# Patient Record
Sex: Female | Born: 1965
Health system: Southern US, Community
[De-identification: ages and names within clinical notes are randomized; demographics above are authoritative.]

## PROBLEM LIST (undated history)

## (undated) DIAGNOSIS — N390 Urinary tract infection, site not specified: Secondary | ICD-10-CM

## (undated) DIAGNOSIS — R011 Cardiac murmur, unspecified: Secondary | ICD-10-CM

## (undated) DIAGNOSIS — Z5189 Encounter for other specified aftercare: Secondary | ICD-10-CM

## (undated) DIAGNOSIS — K8689 Other specified diseases of pancreas: Secondary | ICD-10-CM

## (undated) DIAGNOSIS — D126 Benign neoplasm of colon, unspecified: Secondary | ICD-10-CM

## (undated) DIAGNOSIS — M1990 Unspecified osteoarthritis, unspecified site: Secondary | ICD-10-CM

## (undated) DIAGNOSIS — T7840XA Allergy, unspecified, initial encounter: Secondary | ICD-10-CM

## (undated) DIAGNOSIS — D259 Leiomyoma of uterus, unspecified: Secondary | ICD-10-CM

## (undated) DIAGNOSIS — D649 Anemia, unspecified: Secondary | ICD-10-CM

## (undated) DIAGNOSIS — Z8619 Personal history of other infectious and parasitic diseases: Secondary | ICD-10-CM

## (undated) DIAGNOSIS — R51 Headache: Secondary | ICD-10-CM

## (undated) DIAGNOSIS — F419 Anxiety disorder, unspecified: Secondary | ICD-10-CM

## (undated) DIAGNOSIS — Z91018 Allergy to other foods: Secondary | ICD-10-CM

## (undated) DIAGNOSIS — I700 Atherosclerosis of aorta: Secondary | ICD-10-CM

## (undated) HISTORY — DX: Allergy to other foods: Z91.018

## (undated) HISTORY — DX: Personal history of other infectious and parasitic diseases: Z86.19

## (undated) HISTORY — PX: WISDOM TOOTH EXTRACTION: SHX21

## (undated) HISTORY — DX: Allergy, unspecified, initial encounter: T78.40XA

## (undated) HISTORY — DX: Encounter for other specified aftercare: Z51.89

## (undated) HISTORY — PX: COLONOSCOPY: SHX174

## (undated) HISTORY — DX: Headache: R51

## (undated) HISTORY — DX: Atherosclerosis of aorta: I70.0

## (undated) HISTORY — PX: UTERINE FIBROID SURGERY: SHX826

## (undated) HISTORY — DX: Leiomyoma of uterus, unspecified: D25.9

## (undated) HISTORY — DX: Urinary tract infection, site not specified: N39.0

## (undated) HISTORY — DX: Cardiac murmur, unspecified: R01.1

## (undated) HISTORY — DX: Other specified diseases of pancreas: K86.89

## (undated) HISTORY — DX: Benign neoplasm of colon, unspecified: D12.6

## (undated) HISTORY — DX: Anemia, unspecified: D64.9

## (undated) HISTORY — DX: Unspecified osteoarthritis, unspecified site: M19.90

## (undated) HISTORY — DX: Anxiety disorder, unspecified: F41.9

---

## 1998-05-20 ENCOUNTER — Other Ambulatory Visit: Admission: RE | Admit: 1998-05-20 | Discharge: 1998-05-20 | Payer: Self-pay | Admitting: Obstetrics and Gynecology

## 1998-09-20 ENCOUNTER — Inpatient Hospital Stay (HOSPITAL_COMMUNITY): Admission: AD | Admit: 1998-09-20 | Discharge: 1998-09-20 | Payer: Self-pay | Admitting: Obstetrics and Gynecology

## 1998-12-21 ENCOUNTER — Inpatient Hospital Stay (HOSPITAL_COMMUNITY): Admission: AD | Admit: 1998-12-21 | Discharge: 1998-12-21 | Payer: Self-pay | Admitting: Obstetrics and Gynecology

## 1998-12-21 ENCOUNTER — Encounter: Payer: Self-pay | Admitting: Obstetrics and Gynecology

## 1998-12-25 ENCOUNTER — Inpatient Hospital Stay (HOSPITAL_COMMUNITY): Admission: AD | Admit: 1998-12-25 | Discharge: 1998-12-27 | Payer: Self-pay | Admitting: Obstetrics and Gynecology

## 1999-01-27 ENCOUNTER — Other Ambulatory Visit: Admission: RE | Admit: 1999-01-27 | Discharge: 1999-01-27 | Payer: Self-pay | Admitting: Obstetrics and Gynecology

## 2000-10-10 ENCOUNTER — Other Ambulatory Visit: Admission: RE | Admit: 2000-10-10 | Discharge: 2000-10-10 | Payer: Self-pay | Admitting: Obstetrics and Gynecology

## 2001-10-14 ENCOUNTER — Other Ambulatory Visit: Admission: RE | Admit: 2001-10-14 | Discharge: 2001-10-14 | Payer: Self-pay | Admitting: Obstetrics and Gynecology

## 2003-01-14 ENCOUNTER — Other Ambulatory Visit: Admission: RE | Admit: 2003-01-14 | Discharge: 2003-01-14 | Payer: Self-pay | Admitting: Obstetrics and Gynecology

## 2004-04-05 ENCOUNTER — Other Ambulatory Visit: Admission: RE | Admit: 2004-04-05 | Discharge: 2004-04-05 | Payer: Self-pay | Admitting: Obstetrics and Gynecology

## 2004-08-18 ENCOUNTER — Encounter: Admission: RE | Admit: 2004-08-18 | Discharge: 2004-08-18 | Payer: Self-pay | Admitting: Orthopedic Surgery

## 2004-09-08 ENCOUNTER — Encounter: Admission: RE | Admit: 2004-09-08 | Discharge: 2004-09-08 | Payer: Self-pay | Admitting: Orthopedic Surgery

## 2004-10-11 ENCOUNTER — Other Ambulatory Visit: Admission: RE | Admit: 2004-10-11 | Discharge: 2004-10-11 | Payer: Self-pay | Admitting: Obstetrics and Gynecology

## 2009-12-20 ENCOUNTER — Encounter: Admission: RE | Admit: 2009-12-20 | Discharge: 2009-12-20 | Payer: Self-pay | Admitting: Obstetrics and Gynecology

## 2010-02-27 ENCOUNTER — Encounter: Payer: Self-pay | Admitting: Orthopedic Surgery

## 2014-01-15 ENCOUNTER — Other Ambulatory Visit: Payer: Self-pay | Admitting: Obstetrics and Gynecology

## 2014-01-15 ENCOUNTER — Other Ambulatory Visit (HOSPITAL_COMMUNITY): Payer: Self-pay | Admitting: Obstetrics and Gynecology

## 2014-01-15 DIAGNOSIS — R928 Other abnormal and inconclusive findings on diagnostic imaging of breast: Secondary | ICD-10-CM

## 2014-02-03 ENCOUNTER — Encounter (INDEPENDENT_AMBULATORY_CARE_PROVIDER_SITE_OTHER): Payer: Self-pay

## 2014-02-03 ENCOUNTER — Ambulatory Visit
Admission: RE | Admit: 2014-02-03 | Discharge: 2014-02-03 | Disposition: A | Discharge status: Home / Self Care | Payer: 59 | Source: Ambulatory Visit | Attending: Obstetrics and Gynecology | Admitting: Obstetrics and Gynecology

## 2014-02-03 DIAGNOSIS — R928 Other abnormal and inconclusive findings on diagnostic imaging of breast: Secondary | ICD-10-CM

## 2014-09-14 ENCOUNTER — Ambulatory Visit: Payer: Self-pay | Admitting: Family Medicine

## 2014-11-17 ENCOUNTER — Encounter: Payer: Self-pay | Admitting: Family Medicine

## 2014-11-17 ENCOUNTER — Ambulatory Visit (INDEPENDENT_AMBULATORY_CARE_PROVIDER_SITE_OTHER): Payer: 59 | Admitting: Family Medicine

## 2014-11-17 VITALS — BP 100/70 | HR 84 | Temp 98.4°F | Ht 67.25 in | Wt 148.2 lb

## 2014-11-17 DIAGNOSIS — M2550 Pain in unspecified joint: Secondary | ICD-10-CM | POA: Insufficient documentation

## 2014-11-17 DIAGNOSIS — J309 Allergic rhinitis, unspecified: Secondary | ICD-10-CM

## 2014-11-17 DIAGNOSIS — F330 Major depressive disorder, recurrent, mild: Secondary | ICD-10-CM | POA: Diagnosis not present

## 2014-11-17 DIAGNOSIS — Z23 Encounter for immunization: Secondary | ICD-10-CM

## 2014-11-17 DIAGNOSIS — F411 Generalized anxiety disorder: Secondary | ICD-10-CM | POA: Diagnosis not present

## 2014-11-17 DIAGNOSIS — R5382 Chronic fatigue, unspecified: Secondary | ICD-10-CM

## 2014-11-17 NOTE — Progress Notes (Signed)
HPI:  Audrey Cox is here to establish care.  Last PCP and physical:  Has the following chronic problems that require follow up and concerns today:  GAD/Depression: -reports chronic for many years  -symptoms: constant worry about everything, obsessive over everything being neat and tidy, mildy depressed mood chronically , irritable at times, some cog dysfunction "fog" -denies: SI, thoughts of self harm or harm to others, manic symptoms  Allergic Rhinitis: -since childhood and reports on allergy shots remotely -nasal and eye symptoms -seeing optho -taking allegra -no sob, wheezing, asthma  Chronic fatigue and chronic polyarthralgia: -joint issues primarily in the hands and back -reports told OA of back in the past remotely -reports thinks if from "hormones changing" -denies: fevers, weight loss, redness, swelling, weakness, numbness   ROS negative for unless reported above: fevers, unintentional weight loss, hearing or vision loss, chest pain, palpitations, struggling to breath, hemoptysis, melena, hematochezia, hematuria, falls, loc, si, thoughts of self harm  Past Medical History  Diagnosis Date  . Arthritis     cervical spine and lumbar spine per patient(diagnosed by DC)  . Frequent headaches   . Allergy   . Heart murmur   . Blood transfusion without reported diagnosis     1993 post-op fibroid excision  . Urinary tract infection   . History of chicken pox     Past Surgical History  Procedure Laterality Date  . Uterine fibroid surgery  [id number]    Family History  Problem Relation Age of Onset  . Arthritis Paternal Grandmother   . Arthritis Maternal Grandmother   . Rheum arthritis Paternal Grandfather   . Arthritis Father   . Breast cancer Maternal Aunt   . Skin cancer Maternal Aunt   . Hyperlipidemia Father   . Heart disease Maternal Grandmother   . Hypertension Maternal Grandmother     Social History   Social History  . Marital Status: Married     Spouse Name: N/A  . Number of Children: N/A  . Years of Education: N/A   Social History Main Topics  . Smoking status: Never Smoker   . Smokeless tobacco: None  . Alcohol Use: None  . Drug Use: None  . Sexual Activity: Not Asked   Other Topics Concern  . None   Social History Narrative     Current outpatient prescriptions:  .  fexofenadine (ALLEGRA) 180 MG tablet, Take 180 mg by mouth daily., Disp: , Rfl:   EXAM:  Filed Vitals:   11/17/14 1141  BP: 100/70  Pulse: 84  Temp: 98.4 F (36.9 C)    Body mass index is 23.04 kg/(m^2).  GENERAL: vitals reviewed and listed above, alert, oriented, appears well hydrated and in no acute distress  HEENT: atraumatic, conjunttiva clear, no obvious abnormalities on inspection of external nose and ears  NECK: no obvious masses on inspection  LUNGS: clear to auscultation bilaterally, no wheezes, rales or rhonchi, good air movement  CV: HRRR, no peripheral edema  MS: moves all extremities without noticeable abnormality, no swelling or redness of joints, ? Mild OA nodes hands  PSYCH: pleasant and cooperative, no obvious depression or anxiety  ASSESSMENT AND PLAN:  Discussed the following assessment and plan:  MDD/GAD: -discussed options for tx -she decided to start with CBT -close follow up and prn follow up if any worsening -emergency precautions  AR: -add INS, allergy specialist follow up if uncontrolled and sig morbidity  Polyarthragia/Fatigue: -query from MDD, possible mild OA -advised labs: BMP, CBC, TSH, RF  to eval for other - she opted to do at physical  -We reviewed the PMH, PSH, FH, SH, Meds and Allergies. -We provided refills for any medications we will prescribe as needed. -We addressed current concerns per orders and patient instructions. -We have asked for records for pertinent exams, studies, vaccines and notes from previous providers. -We have advised patient to follow up per instructions  below.   -Patient advised to return or notify a doctor immediately if symptoms worsen or persist or new concerns arise.  Patient Instructions  BEFORE YOU LEAVE: -flu shot -schedule physical in 2-3 months; come fasting and we will plan to do labs that day  Flonase 2 sprays each nostril daily and allegra - follow up with allergist if needed  Call today to schedule an appointment with Dr. Glennon Hamilton to help with the anxiety - follow up at physical or sooner as needed  We recommend the following healthy lifestyle measures: - eat a healthy whole foods diet consisting of regular small meals composed of vegetables, fruits, beans, nuts, seeds, healthy meats such as white chicken and fish and whole grains.  - avoid sweets, white starchy foods, fried foods, fast food, processed foods, sodas, red meet and other fattening foods.  - get a least 150-300 minutes of aerobic exercise per week.       Colin Benton R.

## 2014-11-17 NOTE — Progress Notes (Signed)
Pre visit review using our clinic review tool, if applicable. No additional management support is needed unless otherwise documented below in the visit note. 

## 2014-11-17 NOTE — Patient Instructions (Signed)
BEFORE YOU LEAVE: -flu shot -schedule physical in 2-3 months; come fasting and we will plan to do labs that day  Flonase 2 sprays each nostril daily and allegra - follow up with allergist if needed  Call today to schedule an appointment with Dr. Glennon Hamilton to help with the anxiety - follow up at physical or sooner as needed  We recommend the following healthy lifestyle measures: - eat a healthy whole foods diet consisting of regular small meals composed of vegetables, fruits, beans, nuts, seeds, healthy meats such as white chicken and fish and whole grains.  - avoid sweets, white starchy foods, fried foods, fast food, processed foods, sodas, red meet and other fattening foods.  - get a least 150-300 minutes of aerobic exercise per week.

## 2014-12-29 ENCOUNTER — Ambulatory Visit: Payer: 59 | Admitting: Family Medicine

## 2015-02-10 ENCOUNTER — Ambulatory Visit (INDEPENDENT_AMBULATORY_CARE_PROVIDER_SITE_OTHER): Payer: 59 | Admitting: Family Medicine

## 2015-02-10 ENCOUNTER — Encounter: Payer: Self-pay | Admitting: Family Medicine

## 2015-02-10 VITALS — BP 110/80 | HR 85 | Temp 97.4°F | Ht 67.75 in | Wt 150.6 lb

## 2015-02-10 DIAGNOSIS — M2550 Pain in unspecified joint: Secondary | ICD-10-CM

## 2015-02-10 DIAGNOSIS — Z0000 Encounter for general adult medical examination without abnormal findings: Secondary | ICD-10-CM

## 2015-02-10 DIAGNOSIS — F411 Generalized anxiety disorder: Secondary | ICD-10-CM

## 2015-02-10 DIAGNOSIS — R5382 Chronic fatigue, unspecified: Secondary | ICD-10-CM

## 2015-02-10 DIAGNOSIS — F330 Major depressive disorder, recurrent, mild: Secondary | ICD-10-CM

## 2015-02-10 LAB — COMPREHENSIVE METABOLIC PANEL
ALK PHOS: 74 U/L (ref 39–117)
ALT: 15 U/L (ref 0–35)
AST: 13 U/L (ref 0–37)
Albumin: 4.5 g/dL (ref 3.5–5.2)
BUN: 20 mg/dL (ref 6–23)
CHLORIDE: 102 meq/L (ref 96–112)
CO2: 28 mEq/L (ref 19–32)
Calcium: 9.9 mg/dL (ref 8.4–10.5)
Creatinine, Ser: 0.77 mg/dL (ref 0.40–1.20)
GFR: 85.38 mL/min (ref >60.00)
GLUCOSE: 90 mg/dL (ref 70–99)
POTASSIUM: 4.8 meq/L (ref 3.5–5.1)
SODIUM: 139 meq/L (ref 135–145)
TOTAL PROTEIN: 7.2 g/dL (ref 6.0–8.3)
Total Bilirubin: 0.7 mg/dL (ref 0.2–1.2)

## 2015-02-10 LAB — CBC WITH DIFFERENTIAL/PLATELET
BASOS PCT: 0.6 % (ref 0.0–3.0)
Basophils Absolute: 0.1 10*3/uL (ref 0.0–0.1)
EOS PCT: 1 % (ref 0.0–5.0)
Eosinophils Absolute: 0.1 10*3/uL (ref 0.0–0.7)
HCT: 45.6 % (ref 36.0–46.0)
Hemoglobin: 15.3 g/dL — ABNORMAL HIGH (ref 12.0–15.0)
LYMPHS ABS: 1.9 10*3/uL (ref 0.7–4.0)
Lymphocytes Relative: 20.1 % (ref 12.0–46.0)
MCHC: 33.7 g/dL (ref 30.0–36.0)
MCV: 88.3 fl (ref 78.0–100.0)
MONO ABS: 0.5 10*3/uL (ref 0.1–1.0)
Monocytes Relative: 5.7 % (ref 3.0–12.0)
NEUTROS ABS: 6.9 10*3/uL (ref 1.4–7.7)
NEUTROS PCT: 72.6 % (ref 43.0–77.0)
PLATELETS: 352 10*3/uL (ref 150.0–400.0)
RBC: 5.16 Mil/uL — ABNORMAL HIGH (ref 3.87–5.11)
RDW: 13.3 % (ref 11.5–15.5)
WBC: 9.4 10*3/uL (ref 4.0–10.5)

## 2015-02-10 LAB — LIPID PANEL
CHOLESTEROL: 203 mg/dL — AB (ref 0–200)
HDL: 59.2 mg/dL (ref >39.00)
LDL Cholesterol: 117 mg/dL — ABNORMAL HIGH (ref 0–99)
NonHDL: 144.16
Total CHOL/HDL Ratio: 3
Triglycerides: 137 mg/dL (ref 0.0–149.0)
VLDL: 27.4 mg/dL (ref 0.0–40.0)

## 2015-02-10 LAB — HEMOGLOBIN A1C: HEMOGLOBIN A1C: 5.5 % (ref 4.6–6.5)

## 2015-02-10 LAB — VITAMIN D 25 HYDROXY (VIT D DEFICIENCY, FRACTURES): VITD: 26.34 ng/mL — AB (ref 30.00–100.00)

## 2015-02-10 LAB — TSH: TSH: 1.06 u[IU]/mL (ref 0.35–4.50)

## 2015-02-10 NOTE — Patient Instructions (Addendum)
BEFORE YOU LEAVE: -labs -follow up in 6 months  -We have ordered labs or studies at this visit. It can take up to 1-2 weeks for results and processing. We will contact you with instructions IF your results are abnormal. Normal results will be released to your [facility]. If you have not heard from [place] or can not find your results in [facility] in 2 weeks please contact our office.  We recommend the following healthy lifestyle measures: - eat a healthy whole foods diet consisting of regular small meals composed of vegetables, fruits, beans, nuts, seeds, healthy meats such as white chicken and fish and whole grains.  - avoid sweets, white starchy foods, fried foods, fast food, processed foods, sodas, red meet and other fattening foods.  - get a least 150-300 minutes of aerobic exercise per week.   Vit D3 [phone] IU daily  Consider counseling - please try to call again if you start to feel bad again.

## 2015-02-10 NOTE — Progress Notes (Signed)
HPI:  Here for CPE:  -Concerns and/or follow up today:   GAD/Depression: -reports chronic for many years  - she opted for cbt at last visitbut reports felt better and call was not returned -symptoms: constant worry about everything, obsessive over everything being neat and tidy, mildy depressed mood chronically , irritable at times, some cog dysfunction "fog" -chronic low energy since childhood and muscles hurt when she lifts weights and chronic intermittent back pain and pai nin joints in hands - told was OA in the past wants to check labs for RA. She also wants to check vit D level. Does not take Vit D. -denies: SI, thoughts of self harm or harm to others, manic symptoms  Allergic Rhinitis: -since childhood and reports on allergy shots remotely -nasal and eye symptoms -seeing optho -taking allegra -no sob, wheezing, asthma   -Diet: variety of foods, balance and well rounded, eat out a fair amount  -Exercise:  regular exercise - working with a trainer  -Taking folic acid, vitamin D or calcium: no  -Diabetes and Dyslipidemia Screening: today  -Hx of HTN: no  -Vaccines: UTD  -pap history:sees gyn yearly per her report - Dr. Matthew Saras  -FDLMP: regular, normal  -sexual activity: yes, female partner, no new partners  -wants STI testing (Hep C if born [birth year]): no  -FH breast, colon or ovarian ca: see FH -she reports sees Dr. Matthew Saras for breast and women's health   -Alcohol, Tobacco, drug use: see social history  Review of Systems - no fevers, unintentional weight loss, vision loss, hearing loss, chest pain, sob, hemoptysis, melena, hematochezia, hematuria, genital discharge, changing or concerning skin lesions, bleeding, bruising, loc, thoughts of self harm or SI  Past Medical History  Diagnosis Date  . Arthritis     cervical spine and lumbar spine per patient(diagnosed by DC)  . Frequent headaches   . Allergy   . Heart murmur   . Blood transfusion without reported  diagnosis     1993 post-op fibroid excision  . Urinary tract infection   . History of chicken pox     Past Surgical History  Procedure Laterality Date  . Uterine fibroid surgery  [id number]    Family History  Problem Relation Age of Onset  . Arthritis Paternal Grandmother   . Arthritis Maternal Grandmother   . Rheum arthritis Paternal Grandfather   . Arthritis Father   . Breast cancer Maternal Aunt   . Skin cancer Maternal Aunt   . Hyperlipidemia Father   . Heart disease Maternal Grandmother   . Hypertension Maternal Grandmother     Social History   Social History  . Marital Status: Married    Spouse Name: N/A  . Number of Children: N/A  . Years of Education: N/A   Social History Main Topics  . Smoking status: Never Smoker   . Smokeless tobacco: None  . Alcohol Use: None  . Drug Use: None  . Sexual Activity: Not Asked   Other Topics Concern  . 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  fexofenadine (ALLEGRA) 180 MG tablet, Take 180 mg by mouth daily., Disp: , Rfl:   EXAM:  Filed Vitals:  02/10/15 0832  BP: 110/80  Pulse: 85  Temp: 97.4 F (36.3 C)    GENERAL: vitals reviewed and listed below, alert, oriented, appears well hydrated and in no acute distress  HEENT: head atraumatic, PERRLA, normal appearance of eyes, ears, nose and mouth. moist mucus membranes.  NECK: supple, no masses or lymphadenopathy  LUNGS: clear to auscultation bilaterally, no rales, rhonchi or wheeze  CV: HRRR, no peripheral edema or cyanosis, normal pedal pulses  BREAST: declined  ABDOMEN: bowel sounds normal, soft, non tender to palpation, no masses, no rebound or guarding  GU: declined  SKIN: no rash or abnormal lesions  MS:  normal gait, moves all extremities normally, I do not appreciate and sig swelling or deformity of joints in hands  NEURO: CN II-XII grossly intact, normal muscle strength and sensation to light touch on extremities  PSYCH: normal affect, pleasant and cooperative  ASSESSMENT AND PLAN:  Discussed the following assessment and plan:  Visit for preventive health examination - Plan: TSH, CMP, CBC with Differential, Lipid Panel, Hemoglobin A1c  Mild episode of recurrent major depressive disorder (HCC) GAD (generalized anxiety disorder) Polyarthralgia - Plan: Cyclic citrul peptide antibody, IgG Chronic fatigue -her whole life -encouraged supporting the body with healthy diet, regular gentle exercise and CBT, may consider antidepressant -full labs with physical today including anti ccp   -Discussed and advised all [place] preventive services health task force level A and B recommendations for age, sex and risks.  -Advised at least 150 minutes of exercise per week and a healthy diet low in saturated fats and sweets and consisting of fresh fruits and vegetables, lean meats such as fish and white chicken and whole grains.  -labs, studies and vaccines per orders this encounter  Orders Placed This Encounter  Procedures  . Cyclic citrul peptide antibody, IgG  . TSH  . CMP  . CBC with Differential  . Lipid Panel  . Hemoglobin A1c  . Vitamin D, 25-hydroxy    Patient advised to return to clinic immediately if symptoms worsen or persist or new concerns.  Patient Instructions  BEFORE YOU LEAVE: -labs -follow up in 6 months  -We have ordered labs or studies at this visit. It can take up to 1-2 weeks for results and processing. We will contact you with instructions IF your results are abnormal. Normal results will be released to your [facility]. If you have not heard from [place] or can not find your results in [facility] in 2 weeks please contact our office.  We recommend the following healthy lifestyle  measures: - eat a healthy whole foods diet consisting of regular small meals composed of vegetables, fruits, beans, nuts, seeds, healthy meats such as white chicken and fish and whole grains.  - avoid sweets, white starchy foods, fried foods, fast food, processed foods, sodas, red meet and other fattening foods.  - get a least 150-300 minutes of aerobic exercise per week.   Vit D3 [phone] IU daily  Consider counseling - please try to call again if you start to feel bad again.         No Follow-up on file.  Colin Benton R.

## 2015-02-10 NOTE — Progress Notes (Signed)
Pre visit review using our clinic review tool, if applicable. No additional management support is needed unless otherwise documented below in the visit note. 

## 2015-02-11 LAB — CYCLIC CITRUL PEPTIDE ANTIBODY, IGG: Cyclic Citrullin Peptide Ab: <16 Units

## 2015-08-12 ENCOUNTER — Ambulatory Visit: Payer: 59 | Admitting: Family Medicine

## 2016-04-20 NOTE — Progress Notes (Signed)
HPI:   Schedule as CPE but she wants to reschedule CPE and due regular visit today for anxiety and depression. Declined vaccines today. Is fasting so wants labs.   Hx GAD/Depression: -symptoms since childhood -did not do CBT -declined medications in the past -worsening the last several months -daily symptoms: mildly depressed mood, worries about everything all the time, stressed, feels overwhelmed, poor focus, anhedonia, fatigue, increased desire to sleep -worse around periods, but most of the time -occ pressure or rapid speech but no other manic symptoms now or in the past -? ADD or ADHD, but never diagnosed or treated -no SI, thoughts of self harm or harm to others, no hx of  ROS: See pertinent positives and negatives per HPI.  Past Medical History:  Diagnosis Date  . Allergy   . Arthritis    cervical spine and lumbar spine per patient(diagnosed by DC)  . Blood transfusion without reported diagnosis    1993 post-op fibroid excision  . Frequent headaches   . Heart murmur   . History of chicken pox   . Urinary tract infection     Past Surgical History:  Procedure Laterality Date  . UTERINE FIBROID SURGERY  [id number]    Family History  Problem Relation Age of Onset  . Arthritis Paternal Grandmother   . Arthritis Maternal Grandmother   . Rheum arthritis Paternal Grandfather   . Arthritis Father   . Breast cancer Maternal Aunt   . Skin cancer Maternal Aunt   . Hyperlipidemia Father   . Heart disease Maternal Grandmother   . Hypertension Maternal Grandmother     Social History   Social History  . Marital status: Married    Spouse name: N/A  . Number of children: N/A  . Years of education: N/A   Social History Main Topics  . Smoking status: Never Smoker  . Smokeless tobacco: Never Used  . Alcohol use None  . Drug use: Unknown  . Sexual activity: Not Asked   Other Topics Concern  . 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  fexofenadine (ALLEGRA) 180 MG tablet, Take 180 mg by mouth daily., Disp: , Rfl:   EXAM:  Vitals:   04/21/16 0853  BP: 98/72  Pulse: 91  Temp: 97.8 F (36.6 C)    Body mass index is 23.01 kg/m.  GENERAL: vitals reviewed and listed above, alert, oriented, appears well hydrated and in no acute distress  HEENT: atraumatic, conjunttiva clear, no obvious abnormalities on inspection of external nose and ears  NECK: no obvious masses on inspection  LUNGS: clear to auscultation bilaterally, no wheezes, rales or rhonchi, good air movement  CV: HRRR, no peripheral edema  MS: moves all extremities without noticeable abnormality  PSYCH: pleasant and cooperative, tearful at times, somewhat anxious, no figiting, good eye contact, speech and thought processing grossly intact  ASSESSMENT AND PLAN:  Discussed the following assessment and plan: More than 50% of over 25 minutes spent in total in caring for this patient was spent face-to-face with the patient, counseling and/or coordinating care.    GAD (generalized anxiety disorder) - Plan: TSH  Mild episode of recurrent major depressive disorder ([place]) -  Plan: Hemoglobin A1c  Chronic fatigue - Plan: TSH, Hemoglobin A1c  Screening for hyperlipidemia - Plan: Lipid panel  --we discussed possible serious and likely etiologies, workup and treatment, treatment risks and return precautions - longstanding depression/anxiety possible mild bipolar d/o and possible perimenopausal worsening of symptoms -after this discussion, Audrey Cox opted for CBT/eval with pscyhology -follow up advised 1 month with CPE, labs today as she came fasting -vaccines next visit, declined today -of course, we advised Zykeriah  to return or notify a doctor  immediately if symptoms worsen or persist or new concerns arise.   Patient Instructions  BEFORE YOU LEAVE: -follow up: reschedule physical for 1 month but she will go to lab today  Call today to set up counseling/cognitive behavioral therapy  Call immediately or seek care immediately if symptoms worsening  I hope you feel better soon!  WE NOW OFFER   [facility] FAST TRACK!!!  SAME DAY Appointments for ACUTE CARE  Such as: Sprains, Injuries, cuts, abrasions, rashes, muscle pain, joint pain, back pain Colds, flu, sore throats, headache, allergies, cough, fever  Ear pain, sinus and eye infections Abdominal pain, nausea, vomiting, diarrhea, upset stomach Animal/insect bites  3 Easy Ways to Schedule: Walk-In Scheduling Call in scheduling Mychart Sign-up: [email or web address]            Colin Benton R., DO

## 2016-04-21 ENCOUNTER — Telehealth: Payer: Self-pay | Admitting: *Deleted

## 2016-04-21 ENCOUNTER — Ambulatory Visit (INDEPENDENT_AMBULATORY_CARE_PROVIDER_SITE_OTHER): Payer: 59 | Admitting: Family Medicine

## 2016-04-21 ENCOUNTER — Encounter: Payer: Self-pay | Admitting: Family Medicine

## 2016-04-21 VITALS — BP 98/72 | HR 91 | Temp 97.8°F | Ht 67.5 in | Wt 149.1 lb

## 2016-04-21 DIAGNOSIS — F411 Generalized anxiety disorder: Secondary | ICD-10-CM

## 2016-04-21 DIAGNOSIS — Z13220 Encounter for screening for lipoid disorders: Secondary | ICD-10-CM | POA: Diagnosis not present

## 2016-04-21 DIAGNOSIS — F330 Major depressive disorder, recurrent, mild: Secondary | ICD-10-CM | POA: Diagnosis not present

## 2016-04-21 DIAGNOSIS — R5382 Chronic fatigue, unspecified: Secondary | ICD-10-CM

## 2016-04-21 LAB — LIPID PANEL
CHOLESTEROL: 197 mg/dL (ref 0–200)
HDL: 61.4 mg/dL (ref >39.00)
LDL Cholesterol: 108 mg/dL — ABNORMAL HIGH (ref 0–99)
NonHDL: 135.67
Total CHOL/HDL Ratio: 3
Triglycerides: 136 mg/dL (ref 0.0–149.0)
VLDL: 27.2 mg/dL (ref 0.0–40.0)

## 2016-04-21 LAB — TSH: TSH: 0.69 u[IU]/mL (ref 0.35–4.50)

## 2016-04-21 LAB — HEMOGLOBIN A1C: HEMOGLOBIN A1C: 5.5 % (ref 4.6–6.5)

## 2016-04-21 NOTE — Telephone Encounter (Signed)
Per Dr Maudie Mercury I called Dr Melanee Left office and per Bethann Berkshire she does not start seeing patients until approximately June 2018 or later due to credentialing,etc and Dr Glennon Hamilton is seeing new pts and is scheduled out at least 2 weeks.  I called the pt and left a detailed message at her cell number with this information and to ask for an appt with Dr Glennon Hamilton as per Dr Maudie Mercury she should not wait for an appt in June or later.

## 2016-04-21 NOTE — Progress Notes (Signed)
Pre visit review using our clinic review tool, if applicable. No additional management support is needed unless otherwise documented below in the visit note. 

## 2016-04-21 NOTE — Patient Instructions (Signed)
BEFORE YOU LEAVE: -follow up: reschedule physical for 1 month but she will go to lab today  Call today to set up counseling/cognitive behavioral therapy  Call immediately or seek care immediately if symptoms worsening  I hope you feel better soon!  WE NOW OFFER   [facility] FAST TRACK!!!  SAME DAY Appointments for ACUTE CARE  Such as: Sprains, Injuries, cuts, abrasions, rashes, muscle pain, joint pain, back pain Colds, flu, sore throats, headache, allergies, cough, fever  Ear pain, sinus and eye infections Abdominal pain, nausea, vomiting, diarrhea, upset stomach Animal/insect bites  3 Easy Ways to Schedule: Walk-In Scheduling Call in scheduling Mychart Sign-up: [email or web address]

## 2016-05-04 ENCOUNTER — Ambulatory Visit: Payer: 59 | Admitting: Psychology

## 2016-05-21 NOTE — Progress Notes (Signed)
HPI:  Follow up depression: Note scheduled as CPE but prefers f/u instead as has things to discuss and has CPE with gyn and skin check with dermatologist. Already did labs. Depression and anxiety worse, see PHQ9. Constant worry. Husband with drinking and health issues. Teenagers. Feels safe. No abuse, SI or thoughts of self harm.  Had to cancel psychology appt as husband was in hospital with gallstones and pancreatitis. Plans to reschedule. Wants to try a medication. Also wants to check vit D. Reports hx deficiency. Eats a lot of eggs, butter, red meat and coconut oil. Wants to know how to change cholesterol numbers. Does exercise and eats healthy otherwise. ROS: See pertinent positives and negatives per HPI.  Past Medical History:  Diagnosis Date  . Allergy   . Arthritis    cervical spine and lumbar spine per patient(diagnosed by DC)  . Blood transfusion without reported diagnosis    1993 post-op fibroid excision  . Frequent headaches   . Heart murmur   . History of chicken pox   . Urinary tract infection     Past Surgical History:  Procedure Laterality Date  . UTERINE FIBROID SURGERY  [id number]    Family History  Problem Relation Age of Onset  . Arthritis Paternal Grandmother   . Arthritis Maternal Grandmother   . Rheum arthritis Paternal Grandfather   . Arthritis Father   . Breast cancer Maternal Aunt   . Skin cancer Maternal Aunt   . Hyperlipidemia Father   . Heart disease Maternal Grandmother   . Hypertension Maternal Grandmother     Social History   Social History  . Marital status: Married    Spouse name: N/A  . Number of children: N/A  . Years of education: N/A   Social History Main Topics  . Smoking status: Never Smoker  . Smokeless tobacco: Never Used  . Alcohol use None  . Drug use: Unknown  . Sexual activity: Not Asked   Other Topics Concern  . 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  fexofenadine (ALLEGRA) 180 MG tablet, Take 180 mg by mouth daily., Disp: , Rfl:   EXAM:  Vitals:   05/22/16 0925  BP: 100/78  Pulse: 89  Temp: 98.2 F (36.8 C)    Body mass index is 23.27 kg/m.  GENERAL: vitals reviewed and listed above, alert, oriented, appears well hydrated and in no acute distress  HEENT: atraumatic, conjunttiva clear, no obvious abnormalities on inspection of external nose and ears  NECK: no obvious masses on inspection  LUNGS: clear to auscultation bilaterally, no wheezes, rales or rhonchi, good air movement  CV: HRRR, no peripheral edema  MS: moves all extremities without noticeable abnormality  PSYCH: pleasant and cooperative, no obvious depression or anxiety  ASSESSMENT AND PLAN:  Discussed the following assessment and plan:  Mild episode of recurrent major depressive disorder (HCC)  GAD (generalized anxiety disorder)  Vitamin D deficiency - Plan: VITAMIN D 25 Hydroxy (Vit-D Deficiency, Fractures)  Hyperlipidemia, unspecified hyperlipidemia type  -discussed tx options for depression/anxiety and risks various options. She wants to try counseling and effexor. RX sent. She plans to call counselor to reschedule. F/u 1  month, sooner if needed. -labs per orders -lifestyle recs, advised Mediterranean diet -Patient advised to return or notify a doctor immediately if symptoms worsen or persist or new concerns arise.  There are no Patient Instructions on file for this visit.  Colin Benton R., DO

## 2016-05-22 ENCOUNTER — Encounter: Payer: Self-pay | Admitting: Family Medicine

## 2016-05-22 ENCOUNTER — Ambulatory Visit (INDEPENDENT_AMBULATORY_CARE_PROVIDER_SITE_OTHER): Payer: 59 | Admitting: Family Medicine

## 2016-05-22 VITALS — BP 100/78 | HR 89 | Temp 98.2°F | Ht 67.5 in | Wt 150.8 lb

## 2016-05-22 DIAGNOSIS — F411 Generalized anxiety disorder: Secondary | ICD-10-CM | POA: Diagnosis not present

## 2016-05-22 DIAGNOSIS — F330 Major depressive disorder, recurrent, mild: Secondary | ICD-10-CM

## 2016-05-22 DIAGNOSIS — E559 Vitamin D deficiency, unspecified: Secondary | ICD-10-CM | POA: Diagnosis not present

## 2016-05-22 DIAGNOSIS — E785 Hyperlipidemia, unspecified: Secondary | ICD-10-CM

## 2016-05-22 LAB — VITAMIN D 25 HYDROXY (VIT D DEFICIENCY, FRACTURES): VITD: 27.45 ng/mL — ABNORMAL LOW (ref 30.00–100.00)

## 2016-05-22 MED ORDER — VENLAFAXINE HCL ER 37.5 MG PO CP24: 37.5000 mg | ORAL_CAPSULE | Freq: Every day | ORAL | 3 refills | Status: DC

## 2016-05-22 NOTE — Patient Instructions (Signed)
BEFORE YOU LEAVE: -follow up: 1 month -lab  Start the Effexor and take once daily. If you ever decide to stop this medication please schedule an appointment to discuss a taper.  Call to reschedule counseling appointment.  Try to cut back on bad fats (red meat, eggs, butter, fried foods, etc.) and replace with good fats (canola oil, walnuts, nuts and seeds, olive oil, olives, fish, etc).  I hope you feel better soon!   We recommend the following healthy lifestyle for LIFE: 1) Small portions.   Tip: eat off of a salad plate instead of a dinner plate.  Tip: if you need more or a snack choose fruits, veggies and/or a handful of nuts or seeds.  2) Eat a healthy clean diet.  * Tip: Avoid (less then 1 serving per week): processed foods, sweets, sweetened drinks, white starches (rice, flour, bread, potatoes, pasta, etc), red meat, fast foods, butter  *Tip: CHOOSE instead   * 5-9 servings per day of fresh or frozen fruits and vegetables (but not corn, potatoes, bananas, canned or dried fruit)   *nuts and seeds, beans   *olives and olive oil   *small portions of lean meats such as fish and white chicken    *small portions of whole grains  3)Get at least 150 minutes of sweaty aerobic exercise per week.  4)Reduce stress - consider counseling, meditation and relaxation to balance other aspects of your life.

## 2016-05-22 NOTE — Progress Notes (Signed)
Pre visit review using our clinic review tool, if applicable. No additional management support is needed unless otherwise documented below in the visit note. 

## 2016-05-23 ENCOUNTER — Encounter: Payer: 59 | Admitting: Family Medicine

## 2016-05-31 ENCOUNTER — Telehealth: Payer: Self-pay | Admitting: Family Medicine

## 2016-05-31 NOTE — Telephone Encounter (Signed)
° ° ° °  Pt cancel her appt for May said she decided not to take the [facility] and wanted Dr Maudie Mercury to know

## 2016-05-31 NOTE — Telephone Encounter (Signed)
Spoke to the pt.  She picked up the prescription from the pharmacy but decided not to take it.  She stated that she doesn't feel that she is "that bad."  Mostly has trouble during her menstrual cycle and was afraid the medication would "alter her brain."  She plans of following up with Dr. Glennon Hamilton.  Informed her that I will forward this information to Dr. Maudie Mercury and will call back if any further recommendations.

## 2016-06-22 ENCOUNTER — Ambulatory Visit: Payer: 59 | Admitting: Family Medicine

## 2016-08-01 ENCOUNTER — Ambulatory Visit (INDEPENDENT_AMBULATORY_CARE_PROVIDER_SITE_OTHER): Payer: 59 | Admitting: Psychology

## 2016-08-01 DIAGNOSIS — F411 Generalized anxiety disorder: Secondary | ICD-10-CM

## 2016-08-29 ENCOUNTER — Ambulatory Visit: Payer: 59 | Admitting: Psychology

## 2016-10-23 ENCOUNTER — Telehealth: Payer: Self-pay | Admitting: Family Medicine

## 2016-10-23 NOTE — Telephone Encounter (Signed)
I called the pt and informed her of the message below and a follow up was scheduled for 10/9.

## 2016-10-23 NOTE — Telephone Encounter (Signed)
Ok, ensure has follow up in 4-6 weeks. Thanks.

## 2016-10-23 NOTE — Telephone Encounter (Signed)
° ° ° °  Pt call to say she didn't take the the below med when it was first given to her. She said effective today 10/23/16 she will start taking the below med   venlafaxine XR (EFFEXOR XR) 37.5 MG 24 hr capsule

## 2016-10-26 ENCOUNTER — Encounter: Payer: Self-pay | Admitting: Family Medicine

## 2016-10-31 ENCOUNTER — Ambulatory Visit: Payer: Self-pay | Admitting: Family Medicine

## 2016-10-31 ENCOUNTER — Telehealth: Payer: Self-pay | Admitting: Family Medicine

## 2016-10-31 ENCOUNTER — Ambulatory Visit (INDEPENDENT_AMBULATORY_CARE_PROVIDER_SITE_OTHER): Payer: BLUE CROSS/BLUE SHIELD | Admitting: Family Medicine

## 2016-10-31 ENCOUNTER — Encounter: Payer: Self-pay | Admitting: Family Medicine

## 2016-10-31 VITALS — BP 96/68 | HR 100 | Temp 98.3°F | Ht 67.5 in

## 2016-10-31 DIAGNOSIS — F330 Major depressive disorder, recurrent, mild: Secondary | ICD-10-CM

## 2016-10-31 DIAGNOSIS — F411 Generalized anxiety disorder: Secondary | ICD-10-CM

## 2016-10-31 DIAGNOSIS — K219 Gastro-esophageal reflux disease without esophagitis: Secondary | ICD-10-CM

## 2016-10-31 DIAGNOSIS — R079 Chest pain, unspecified: Secondary | ICD-10-CM | POA: Diagnosis not present

## 2016-10-31 MED ORDER — ESCITALOPRAM OXALATE 5 MG PO TABS: 5.0000 mg | ORAL_TABLET | Freq: Every day | ORAL | 1 refills | Status: DC

## 2016-10-31 NOTE — Telephone Encounter (Signed)
Pt state that she is having side effect from venlafaxine XR (EFFEXOR XR) that she is not able to deal with and has stopped taking it and would like to see what Dr. Maudie Mercury would suggest her to do.

## 2016-10-31 NOTE — Telephone Encounter (Signed)
Appt scheduled for today at 11:15am.

## 2016-10-31 NOTE — Patient Instructions (Signed)
BEFORE YOU LEAVE: -EKG -follow up: 1 month  STOP the Effexor.  START the Lexapro 5 mg once daily.  Continue regular counseling.  Get regular gentle exercise and eat a healthy diet.  Start over-the-counter Nexium and take once daily for 1 week for that acid reflux.  For the chest discomfort you could try a topical menthol ( Tiger balm - this is available over-the-counter), heat, gentle stretching and as needed Tylenol or Aleve per instructions.

## 2016-10-31 NOTE — Telephone Encounter (Signed)
appt please so we can discuss and figure out what to do next.
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
## 2016-11-14 ENCOUNTER — Ambulatory Visit: Payer: Self-pay | Admitting: Family Medicine

## 2016-11-21 ENCOUNTER — Ambulatory Visit: Payer: Self-pay | Admitting: Family Medicine

## 2016-12-13 DIAGNOSIS — Z6822 Body mass index (BMI) 22.0-22.9, adult: Secondary | ICD-10-CM | POA: Diagnosis not present

## 2016-12-13 DIAGNOSIS — Z01419 Encounter for gynecological examination (general) (routine) without abnormal findings: Secondary | ICD-10-CM | POA: Diagnosis not present

## 2016-12-13 DIAGNOSIS — Z1231 Encounter for screening mammogram for malignant neoplasm of breast: Secondary | ICD-10-CM | POA: Diagnosis not present

## 2016-12-20 NOTE — Progress Notes (Signed)
HPI:  DUe for tetanus booster  Follow up Depression and anxiety: -she did not tolerate effexor -started low dose lexapro 10/2016 -doing CBT -reports: Doing better, reports the Lexapro has taken the edge off of her symptoms and PMS was much better this month, see PHQ 9 -she does still have some depressed mood on a regular basis and some other symptoms of depression -denies: Suicidal ideation, severe symptoms side effects to the medication  GERD: -did trial PPI x 1 week -reports: Symptoms resolved with the PPI.  She does have recurrence of symptoms with dietary indiscretion.  Chest wall pain: -1 month follow up, reproducible on exam -reports: Resolved ROS: See pertinent positives and negatives per HPI.  Past Medical History:  Diagnosis Date  . Allergy   . Arthritis    cervical spine and lumbar spine per patient(diagnosed by DC)  . Blood transfusion without reported diagnosis    1993 post-op fibroid excision  . Frequent headaches   . Heart murmur   . History of chicken pox   . Urinary tract infection     Past Surgical History:  Procedure Laterality Date  . UTERINE FIBROID SURGERY  [id number]    Family History  Problem Relation Age of Onset  . Arthritis Paternal Grandmother   . Arthritis Maternal Grandmother   . Rheum arthritis Paternal Grandfather   . Arthritis Father   . Breast cancer Maternal Aunt   . Skin cancer Maternal Aunt   . Hyperlipidemia Father   . Heart disease Maternal Grandmother   . Hypertension Maternal Grandmother     Social History   Socioeconomic History  . Marital status: Married    Spouse name: None  . Number of children: None  . Years of education: None  . Highest education level: None  Social Needs  . Financial resource strain: None  . Food insecurity - worry: None  . Food insecurity - inability: None  . Transportation needs - medical: None  . Transportation needs - non-medical: None  Occupational History  . None  Tobacco Use  .  Smoking status: Never Smoker  . Smokeless tobacco: Never Used  Substance and Sexual Activity  . Alcohol use: None  . Drug use: None  . Sexual activity: None  Other Topics Concern  . 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  escitalopram (LEXAPRO) 5 MG tablet, Take 1 tablet (5 mg total) by mouth daily., Disp: 30 tablet, Rfl: 1 .  fexofenadine (ALLEGRA) 180 MG tablet, Take 180 mg by mouth daily., Disp: , Rfl:   EXAM:  Vitals:   12/21/16 1018  BP: 94/60  Pulse: 90  Temp: 97.8 F (36.6 C)    Body mass index is 23.15 kg/m.  GENERAL: vitals reviewed and listed above, alert, oriented, appears well hydrated and in no acute distress  HEENT: atraumatic, conjunttiva clear, no obvious abnormalities on inspection of external nose and ears  NECK: no obvious masses on inspection  LUNGS: clear to auscultation bilaterally, no wheezes, rales or rhonchi, good air movement  CV: HRRR, no peripheral edema  MS: moves all extremities without noticeable abnormality  PSYCH: pleasant and cooperative, no obvious depression or anxiety  ASSESSMENT AND PLAN:  Discussed  the following assessment and plan:  Mild episode of recurrent major depressive disorder (HCC)  GAD (generalized anxiety disorder)  Gastroesophageal reflux disease, esophagitis presence not specified  -Discussed options for management of the depression, she opted to increase the Lexapro to 10 mg to see how she does with this -Cognitive behavioral therapy advised -Over-the-counter PPI as needed for acid reflux -Follow-up in 4-6 weeks -Risk benefits of flu shot discussed and she plans to get this today -Patient advised to return or notify a doctor immediately if symptoms worsen or persist or new  concerns arise.  Patient Instructions  BEFORE YOU LEAVE: -follow up: 4-6 weeks    increase Lexapro to 10 mg daily  Use the over-the-counter acid reducer as needed if you are having symptoms on a regular basis   Heartburn Heartburn is a type of pain or discomfort that can happen in the throat or chest. It is often described as a burning pain. It may also cause a bad taste in the mouth. Heartburn may feel worse when you lie down or bend over. It may be caused by stomach contents that move back up (reflux) into the tube that connects the mouth with the stomach (esophagus). Follow these instructions at home: Take these actions to lessen your discomfort and to help avoid problems. Diet  Follow a diet as told by your doctor. You may need to avoid foods and drinks such as: ? Coffee and tea (with or without caffeine). ? Drinks that contain alcohol. ? Energy drinks and sports drinks. ? Carbonated drinks or sodas. ? Chocolate and cocoa. ? Peppermint and mint flavorings. ? Garlic and onions. ? Horseradish. ? Spicy and acidic foods, such as peppers, chili powder, curry powder, vinegar, hot sauces, and BBQ sauce. ? Citrus fruit juices and citrus fruits, such as oranges, lemons, and limes. ? Tomato-based foods, such as red sauce, chili, salsa, and pizza with red sauce. ? Fried and fatty foods, such as donuts, french fries, potato chips, and high-fat dressings. ? High-fat meats, such as hot dogs, rib eye steak, sausage, ham, and bacon. ? High-fat dairy items, such as whole milk, butter, and cream cheese.  Eat small meals often. Avoid eating large meals.  Avoid drinking large amounts of liquid with your meals.  Avoid eating meals during the 2-3 hours before bedtime.  Avoid lying down right after you eat.  Do not exercise right after you eat. General instructions  Pay attention to any changes in your symptoms.  Take over-the-counter and prescription medicines only as told by your  doctor. Do not take aspirin, ibuprofen, or other NSAIDs unless your doctor says it is okay.  Do not use any tobacco products, including cigarettes, chewing tobacco, and e-cigarettes. If you need help quitting, ask your doctor.  Wear loose clothes. Do not wear anything tight around your waist.  Raise (elevate) the head of your bed about 6 inches (15 cm).  Try to lower your stress. If you need help doing this, ask your doctor.  If you are overweight, lose an amount of weight that is healthy for you. Ask your doctor about a safe weight loss goal.  Keep all follow-up visits as told by your doctor. This is important. Contact a doctor if:  You have new symptoms.  You lose weight and you do not know why it is happening.  You have trouble swallowing, or it hurts to swallow.  You have wheezing or a cough that keeps happening.  Your symptoms do not get better with  treatment.  You have heartburn often for more than two weeks. Get help right away if:  You have pain in your arms, neck, jaw, teeth, or back.  You feel sweaty, dizzy, or light-headed.  You have chest pain or shortness of breath.  You throw up (vomit) and your throw up looks like blood or coffee grounds.  Your poop (stool) is bloody or black. This information is not intended to replace advice given to you by your health care provider. Make sure you discuss any questions you have with your health care provider. Document Released: 10/05/2010 Document Revised: 07/01/2015 Document Reviewed: 05/20/2014 Elsevier Interactive Patient Education  2018 [employer]., DO

## 2016-12-21 ENCOUNTER — Ambulatory Visit: Payer: BLUE CROSS/BLUE SHIELD | Admitting: Family Medicine

## 2016-12-21 ENCOUNTER — Encounter: Payer: Self-pay | Admitting: Family Medicine

## 2016-12-21 VITALS — BP 94/60 | HR 90 | Temp 97.8°F | Ht 67.5 in | Wt 150.0 lb

## 2016-12-21 DIAGNOSIS — K219 Gastro-esophageal reflux disease without esophagitis: Secondary | ICD-10-CM | POA: Diagnosis not present

## 2016-12-21 DIAGNOSIS — Z23 Encounter for immunization: Secondary | ICD-10-CM | POA: Diagnosis not present

## 2016-12-21 DIAGNOSIS — F330 Major depressive disorder, recurrent, mild: Secondary | ICD-10-CM

## 2016-12-21 DIAGNOSIS — F411 Generalized anxiety disorder: Secondary | ICD-10-CM

## 2016-12-21 MED ORDER — ESCITALOPRAM OXALATE 10 MG PO TABS: 10.0000 mg | ORAL_TABLET | Freq: Every day | ORAL | 3 refills | Status: DC

## 2016-12-21 NOTE — Patient Instructions (Addendum)
BEFORE YOU LEAVE: -follow up: 4-6 weeks    increase Lexapro to 10 mg daily  Use the over-the-counter acid reducer as needed if you are having symptoms on a regular basis   Heartburn Heartburn is a type of pain or discomfort that can happen in the throat or chest. It is often described as a burning pain. It may also cause a bad taste in the mouth. Heartburn may feel worse when you lie down or bend over. It may be caused by stomach contents that move back up (reflux) into the tube that connects the mouth with the stomach (esophagus). Follow these instructions at home: Take these actions to lessen your discomfort and to help avoid problems. Diet  Follow a diet as told by your doctor. You may need to avoid foods and drinks such as: ? Coffee and tea (with or without caffeine). ? Drinks that contain alcohol. ? Energy drinks and sports drinks. ? Carbonated drinks or sodas. ? Chocolate and cocoa. ? Peppermint and mint flavorings. ? Garlic and onions. ? Horseradish. ? Spicy and acidic foods, such as peppers, chili powder, curry powder, vinegar, hot sauces, and BBQ sauce. ? Citrus fruit juices and citrus fruits, such as oranges, lemons, and limes. ? Tomato-based foods, such as red sauce, chili, salsa, and pizza with red sauce. ? Fried and fatty foods, such as donuts, french fries, potato chips, and high-fat dressings. ? High-fat meats, such as hot dogs, rib eye steak, sausage, ham, and bacon. ? High-fat dairy items, such as whole milk, butter, and cream cheese.  Eat small meals often. Avoid eating large meals.  Avoid drinking large amounts of liquid with your meals.  Avoid eating meals during the 2-3 hours before bedtime.  Avoid lying down right after you eat.  Do not exercise right after you eat. General instructions  Pay attention to any changes in your symptoms.  Take over-the-counter and prescription medicines only as told by your doctor. Do not take aspirin, ibuprofen, or other  NSAIDs unless your doctor says it is okay.  Do not use any tobacco products, including cigarettes, chewing tobacco, and e-cigarettes. If you need help quitting, ask your doctor.  Wear loose clothes. Do not wear anything tight around your waist.  Raise (elevate) the head of your bed about 6 inches (15 cm).  Try to lower your stress. If you need help doing this, ask your doctor.  If you are overweight, lose an amount of weight that is healthy for you. Ask your doctor about a safe weight loss goal.  Keep all follow-up visits as told by your doctor. This is important. Contact a doctor if:  You have new symptoms.  You lose weight and you do not know why it is happening.  You have trouble swallowing, or it hurts to swallow.  You have wheezing or a cough that keeps happening.  Your symptoms do not get better with treatment.  You have heartburn often for more than two weeks. Get help right away if:  You have pain in your arms, neck, jaw, teeth, or back.  You feel sweaty, dizzy, or light-headed.  You have chest pain or shortness of breath.  You throw up (vomit) and your throw up looks like blood or coffee grounds.  Your poop (stool) is bloody or black. This information is not intended to replace advice given to you by your health care provider. Make sure you discuss any questions you have with your health care provider. Document Released: 10/05/2010 Document Revised: 07/01/2015  Document Reviewed: 05/20/2014 Elsevier Interactive Patient Education  [employer].

## 2016-12-21 NOTE — Addendum Note (Signed)
Addended by: Lahoma Crocker A on: 12/21/2016 11:09 AM   Modules accepted: Orders

## 2016-12-23 ENCOUNTER — Other Ambulatory Visit: Payer: Self-pay | Admitting: Family Medicine

## 2017-02-08 ENCOUNTER — Ambulatory Visit: Payer: Self-pay | Admitting: Family Medicine

## 2017-02-15 ENCOUNTER — Encounter: Payer: Self-pay | Admitting: Family Medicine

## 2017-02-20 ENCOUNTER — Other Ambulatory Visit: Payer: Self-pay | Admitting: *Deleted

## 2017-02-20 MED ORDER — ESCITALOPRAM OXALATE 10 MG PO TABS: 10.0000 mg | ORAL_TABLET | Freq: Every day | ORAL | 1 refills | Status: DC

## 2017-02-20 NOTE — Telephone Encounter (Signed)
Rx done. 

## 2017-02-28 ENCOUNTER — Ambulatory Visit: Payer: Self-pay | Admitting: *Deleted

## 2017-02-28 NOTE — Telephone Encounter (Signed)
Pt  Reports  Symptoms of   Lightheaded   that  Is   Worse    When  She  Stands up  As   Well  As  Some   Stress    And    A  Heavy   menstrual  Cycle  That  Is   Ending today  -  She  Reports the  Symptoms  X  6  Days     She   Reports    She  has   Recently  Weaned  Herself  Off   LEXAPRO    As   Well .   She  Also reports   Stressors  In  Her  Life   Recently  Also     Reason for Disposition . [1] MILD dizziness (e.g., walking normally) AND [2] has NOT been evaluated by physician for this  (Exception: dizziness caused by heat exposure, sudden standing, or poor fluid intake)  Answer Assessment - Initial Assessment Questions 1. DESCRIPTION: "Describe your dizziness."      Swimmy  Headed      X    6  Days         2. LIGHTHEADED: "Do you feel lightheaded?" (e.g., somewhat faint, woozy, weak upon standing)       Yes     Feels  Woozy    Gets  lightleaded   When   She   Stands     3. VERTIGO: "Do you feel like either you or the room is spinning or tilting?" (i.e. vertigo)       No   4. SEVERITY: "How bad is it?"  "Do you feel like you are going to faint?" "Can you stand and walk?"   - MILD - walking normally   - MODERATE - interferes with normal activities (e.g., work, school)    - SEVERE - unable to stand, requires support to walk, feels like passing out now.       Mild   5. ONSET:  "When did the dizziness begin?"      6 days   Ago    6. AGGRAVATING FACTORS: "Does anything make it worse?" (e.g., standing, change in head position)     Standing   Exertion    Stress    7. HEART RATE: "Can you tell me your heart rate?" "How many beats in 15 seconds?"  (Note: not all patients can do this)         84 8. CAUSE: "What do you think is causing the dizziness?"      Recent lexaporo  Dosage  Changes   Last  Dose   8  Days  After gradually  Weaning   Heavy   Period   Last  Week   Stopped  Today   9. RECURRENT SYMPTOM: "Have you had dizziness before?" If so, ask: "When was the last time?" "What happened that  time?"      NO    10. OTHER SYMPTOMS: "Do you have any other symptoms?" (e.g., fever, chest pain, vomiting, diarrhea, bleeding)      SLIGHT DIARRHEA   HEAVY  RECENT MENSES    11. PREGNANCY: "Is there any chance you are pregnant?" "When was your last menstrual period?"       JUST  FINISHED  Protocols used: DIZZINESS Heidi Dach

## 2017-03-01 ENCOUNTER — Encounter: Payer: Self-pay | Admitting: Family Medicine

## 2017-03-01 ENCOUNTER — Ambulatory Visit: Payer: BLUE CROSS/BLUE SHIELD | Admitting: Family Medicine

## 2017-03-01 VITALS — BP 100/80 | HR 84 | Temp 98.4°F | Ht 67.5 in | Wt 151.2 lb

## 2017-03-01 DIAGNOSIS — F411 Generalized anxiety disorder: Secondary | ICD-10-CM | POA: Diagnosis not present

## 2017-03-01 DIAGNOSIS — F330 Major depressive disorder, recurrent, mild: Secondary | ICD-10-CM | POA: Diagnosis not present

## 2017-03-01 DIAGNOSIS — J069 Acute upper respiratory infection, unspecified: Secondary | ICD-10-CM | POA: Diagnosis not present

## 2017-03-01 NOTE — Progress Notes (Addendum)
HPI:  Acute visit for respiratory illness and follow up anxiety/depression:  URI -started: 1 week ago -symptoms:nasal congestion, sore throat, cough, ears feel full, feels off - pressure in face - not really lightheaded/dizzy -denies:fever, SOB, NVD, tooth pain, sinus pain -has tried: nothing -sick contacts/travel/risks: no reported flu, strep or tick exposure  Anxiety/ Depression: -has a lot of stress - husband has a big heart but is "functioning alcoholic" and has 50 yo teenager whom is causing stress by "being a normal teenager" -she feels safe -she decided she did not want to take medication for her anxiety and depression, since she feels is her husband and son's fault and if they don't want to get help then she doesn't feel she should have to take medications that make her feel "numb". Reports Lexapro helped at 5 mg, but at 10 mg made her feel like she did not care about anything.  I did not like that feeling.  He also thinks it may her gain weight. -She is agreed to cognitive behavioral therapy, but does not want to take medications for now -Denies thoughts of harm to herself or others, however PHQ 9 score is high at 14  ROS: See pertinent positives and negatives per HPI.  Past Medical History:  Diagnosis Date  . Allergy   . Arthritis    cervical spine and lumbar spine per patient(diagnosed by DC)  . Blood transfusion without reported diagnosis    1993 post-op fibroid excision  . Frequent headaches   . Heart murmur   . History of chicken pox   . Urinary tract infection     Past Surgical History:  Procedure Laterality Date  . UTERINE FIBROID SURGERY  [id number]    Family History  Problem Relation Age of Onset  . Arthritis Paternal Grandmother   . Arthritis Maternal Grandmother   . Rheum arthritis Paternal Grandfather   . Arthritis Father   . Breast cancer Maternal Aunt   . Skin cancer Maternal Aunt   . Hyperlipidemia Father   . Heart disease Maternal Grandmother    . Hypertension Maternal Grandmother     Social History   Socioeconomic History  . Marital status: Married    Spouse name: None  . Number of children: None  . Years of education: None  . Highest education level: None  Social Needs  . Financial resource strain: None  . Food insecurity - worry: None  . Food insecurity - inability: None  . Transportation needs - medical: None  . Transportation needs - non-medical: None  Occupational History  . None  Tobacco Use  . Smoking status: Never Smoker  . Smokeless tobacco: Never Used  Substance and Sexual Activity  . Alcohol use: None  . Drug use: None  . Sexual activity: None  Other Topics Concern  . 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  fexofenadine (ALLEGRA) 180 MG tablet, Take 180 mg by mouth daily., Disp: , Rfl:   EXAM:  Vitals:   03/01/17 1147  BP: 100/80  Pulse: 84  Temp: 98.4 F (36.9 C)  SpO2: 98%    Body mass index is 23.33 kg/m.  GENERAL: vitals reviewed and listed above, alert,  oriented, appears well hydrated and in no acute distress  HEENT: atraumatic, conjunttiva clear, no obvious abnormalities on inspection of external nose and ears, normal appearance of ear canals and TMs, clear nasal congestion, mild post oropharyngeal erythema with PND, no tonsillar edema or exudate, no sinus TTP  NECK: no obvious masses on inspection  LUNGS: clear to auscultation bilaterally, no wheezes, rales or rhonchi, good air movement  CV: HRRR, no peripheral edema  MS: moves all extremities without noticeable abnormality  PSYCH: pleasant and cooperative, no obvious depression or anxiety  ASSESSMENT AND PLAN:  Discussed the following assessment and plan:  Viral upper respiratory  illness  GAD (generalized anxiety disorder)  Mild episode of recurrent major depressive disorder ([place])  -given HPI and exam findings today, a serious infection or illness is unlikely. We discussed potential etiologies, with VURI being most likely, and advised supportive care and monitoring. We discussed treatment side effects, likely course, antibiotic misuse, transmission, and signs of developing a serious illness. -PHQ9 reviewed and counseling > 50% of 15 minutes. She has had worsening of her anxiety and depression but does not want to take medication for this.  Advised cognitive behavioral therapy, she has seen Dr. Glennon Hamilton once in the past.  She agrees to schedule an appointment. -of course, we advised to return or notify a doctor immediately if symptoms worsen or persist or new concerns arise.    Patient Instructions  BEFORE YOU LEAVE: -follow up: with Dr. Maudie Mercury in 2-3 months  Call today to schedule therapy with Dr. Glennon Hamilton.  Care promptly if anxiety and depression symptoms are worsening, thoughts of harm or severe symptoms.  Emergency care if severe symptoms or thoughts of harm to self or others.   INSTRUCTIONS FOR UPPER RESPIRATORY INFECTION:  -plenty of rest and fluids  -nasal saline wash 2-3 times daily (use prepackaged nasal saline or bottled/distilled water if making your own)   -can use AFRIN nasal spray for drainage and nasal congestion - but do NOT use longer then 3-4 days  -can use tylenol (in no history of liver disease) or ibuprofen (if no history of kidney disease, bowel bleeding or significant heart disease) as directed for aches and sorethroat  -in the winter time, using a humidifier at night is helpful (please follow cleaning instructions)  -if you are taking a cough medication - use only as directed, may also try a teaspoon of honey to coat the throat and throat lozenges. If given a cough medication with codeine or hydrocodone or other narcotic please be advised that this  contains a strong and  potentially addicting medication. Please follow instructions carefully, take as little as possible and only use AS NEEDED for severe cough. Discuss potential side effects with your pharmacy. Please do not drive or operate machinery while taking these types of medications. Please do not take other sedating medications, drugs or alcohol while taking this medication without discussing with your doctor.  -for sore throat, salt water gargles can help  -follow up if you have fevers, facial pain, tooth pain, difficulty breathing or are worsening or symptoms persist longer then expected  Upper Respiratory Infection, Adult An upper respiratory infection (URI) is also known as the common cold. It is often caused by a type of germ (virus). Colds are easily spread (contagious). You can pass it to others by kissing, coughing, sneezing, or drinking out of the same glass. Usually, you get better in 1 to 3  weeks.  However, the cough can last for even longer.  HOME CARE   Only take medicine as told by your doctor. Follow instructions provided above.  Drink enough water and fluids to keep your pee (urine) clear or pale yellow.  Get plenty of rest.  Return to work when your temperature is < 100 for 24 hours or as told by your doctor. You may use a face mask and wash your hands to stop your cold from spreading. GET HELP RIGHT AWAY IF:   After the first few days, you feel you are getting worse.  You have questions about your medicine.  You have chills, shortness of breath, or red spit (mucus).  You have pain in the face for more then 1-2 days, especially when you bend forward.  You have a fever, puffy (swollen) neck, pain when you swallow, or white spots in the back of your throat.  You have a bad headache, ear pain, sinus pain, or chest pain.  You have a high-pitched whistling sound when you breathe in and out (wheezing).  You cough up blood.  You have sore muscles or a stiff  neck. MAKE SURE YOU:   Understand these instructions.  Will watch your condition.  Will get help right away if you are not doing well or get worse. Document Released: 07/12/2007 Document Revised: 04/17/2011 Document Reviewed: 04/30/2013 [facility] Patient Information 2015 [place], [place]. This information is not intended to replace advice given to you by your health care provider. Make sure you discuss any questions you have with your health care provider.     Lucretia Kern, DO

## 2017-03-01 NOTE — Patient Instructions (Signed)
BEFORE YOU LEAVE: -follow up: with Dr. Maudie Mercury in 2-3 months  Call today to schedule therapy with Dr. Glennon Hamilton.  Care promptly if anxiety and depression symptoms are worsening, thoughts of harm or severe symptoms.  Emergency care if severe symptoms or thoughts of harm to self or others.   INSTRUCTIONS FOR UPPER RESPIRATORY INFECTION:  -plenty of rest and fluids  -nasal saline wash 2-3 times daily (use prepackaged nasal saline or bottled/distilled water if making your own)   -can use AFRIN nasal spray for drainage and nasal congestion - but do NOT use longer then 3-4 days  -can use tylenol (in no history of liver disease) or ibuprofen (if no history of kidney disease, bowel bleeding or significant heart disease) as directed for aches and sorethroat  -in the winter time, using a humidifier at night is helpful (please follow cleaning instructions)  -if you are taking a cough medication - use only as directed, may also try a teaspoon of honey to coat the throat and throat lozenges. If given a cough medication with codeine or hydrocodone or other narcotic please be advised that this contains a strong and  potentially addicting medication. Please follow instructions carefully, take as little as possible and only use AS NEEDED for severe cough. Discuss potential side effects with your pharmacy. Please do not drive or operate machinery while taking these types of medications. Please do not take other sedating medications, drugs or alcohol while taking this medication without discussing with your doctor.  -for sore throat, salt water gargles can help  -follow up if you have fevers, facial pain, tooth pain, difficulty breathing or are worsening or symptoms persist longer then expected  Upper Respiratory Infection, Adult An upper respiratory infection (URI) is also known as the common cold. It is often caused by a type of germ (virus). Colds are easily spread (contagious). You can pass it to others by kissing,  coughing, sneezing, or drinking out of the same glass. Usually, you get better in 1 to 3  weeks.  However, the cough can last for even longer. HOME CARE   Only take medicine as told by your doctor. Follow instructions provided above.  Drink enough water and fluids to keep your pee (urine) clear or pale yellow.  Get plenty of rest.  Return to work when your temperature is < 100 for 24 hours or as told by your doctor. You may use a face mask and wash your hands to stop your cold from spreading. GET HELP RIGHT AWAY IF:   After the first few days, you feel you are getting worse.  You have questions about your medicine.  You have chills, shortness of breath, or red spit (mucus).  You have pain in the face for more then 1-2 days, especially when you bend forward.  You have a fever, puffy (swollen) neck, pain when you swallow, or white spots in the back of your throat.  You have a bad headache, ear pain, sinus pain, or chest pain.  You have a high-pitched whistling sound when you breathe in and out (wheezing).  You cough up blood.  You have sore muscles or a stiff neck. MAKE SURE YOU:   Understand these instructions.  Will watch your condition.  Will get help right away if you are not doing well or get worse. Document Released: 07/12/2007 Document Revised: 04/17/2011 Document Reviewed: 04/30/2013 [facility] Patient Information 2015 [place], [place]. This information is not intended to replace advice given to you by your health care  Make sure you discuss any questions you have with your health care provider.   

## 2017-03-13 DIAGNOSIS — H16223 Keratoconjunctivitis sicca, not specified as Sjogren's, bilateral: Secondary | ICD-10-CM | POA: Diagnosis not present

## 2017-03-13 DIAGNOSIS — H5213 Myopia, bilateral: Secondary | ICD-10-CM | POA: Diagnosis not present

## 2017-03-13 DIAGNOSIS — H52223 Regular astigmatism, bilateral: Secondary | ICD-10-CM | POA: Diagnosis not present

## 2017-03-13 DIAGNOSIS — H40013 Open angle with borderline findings, low risk, bilateral: Secondary | ICD-10-CM | POA: Diagnosis not present

## 2017-04-02 ENCOUNTER — Ambulatory Visit: Payer: BLUE CROSS/BLUE SHIELD | Admitting: Psychology

## 2017-04-02 DIAGNOSIS — F411 Generalized anxiety disorder: Secondary | ICD-10-CM

## 2017-05-03 ENCOUNTER — Ambulatory Visit: Payer: BLUE CROSS/BLUE SHIELD | Admitting: Psychology

## 2017-05-18 ENCOUNTER — Encounter: Payer: Self-pay | Admitting: Adult Health

## 2017-05-18 ENCOUNTER — Ambulatory Visit: Payer: Self-pay

## 2017-05-18 ENCOUNTER — Ambulatory Visit: Payer: BLUE CROSS/BLUE SHIELD | Admitting: Adult Health

## 2017-05-18 VITALS — BP 112/72 | Temp 97.7°F | Wt 151.0 lb

## 2017-05-18 DIAGNOSIS — M62838 Other muscle spasm: Secondary | ICD-10-CM

## 2017-05-18 MED ORDER — PREDNISONE 10 MG PO TABS: ORAL_TABLET | ORAL | 0 refills | Status: DC

## 2017-05-18 MED ORDER — CYCLOBENZAPRINE HCL 10 MG PO TABS: 10.0000 mg | ORAL_TABLET | Freq: Three times a day (TID) | ORAL | 0 refills | Status: DC | PRN

## 2017-05-18 NOTE — Progress Notes (Signed)
Subjective:    Patient ID: Audrey Cox, female    DOB: 11/01/1965, 52 y.o.   MRN: [id number]  HPI  52 year old female who  has a past medical history of Allergy, Arthritis, Blood transfusion without reported diagnosis, Frequent headaches, Heart murmur, History of chicken pox, and Urinary tract infection. She presents to the office today for the complaint of muscle spasm in her left lower back. Reports yesterday was getting into her daughters car and felt a spasm in her lower back. Was awakened at night with intense pain and stiffness in her lower back.   Denies any other trauma, issues with bowel or bladder or spinal pain.   Stretching exercises cause relief.    Review of Systems See HPI   Past Medical History:  Diagnosis Date  . Allergy   . Arthritis    cervical spine and lumbar spine per patient(diagnosed by DC)  . Blood transfusion without reported diagnosis    1993 post-op fibroid excision  . Frequent headaches   . Heart murmur   . History of chicken pox   . Urinary tract infection     Social History   Socioeconomic History  . Marital status: Married    Spouse name: Not on file  . Number of children: Not on file  . Years of education: Not on file  . Highest education level: Not on file  Occupational History  . Not on file  Social Needs  . Financial resource strain: Not on file  . Food insecurity:    Worry: Not on file    Inability: Not on file  . Transportation needs:    Medical: Not on file    Non-medical: Not on file  Tobacco Use  . Smoking status: Never Smoker  . Smokeless tobacco: Never Used  Substance and Sexual Activity  . Alcohol use: Not on file  . Drug use: Not on file  . Sexual activity: Not on file  Lifestyle  . Physical activity:    Days per week: Not on file    Minutes per session: Not on file  . Stress: Not on file  Relationships  . Social connections:    Talks on phone: Not on file    Gets together: Not on file    Attends  religious service: Not on file    Active member of club or organization: Not on file    Attends meetings of clubs or organizations: Not on file    Relationship status: Not on file  . Intimate partner violence:    Fear of current or ex partner: Not on file    Emotionally abused: Not on file    Physically abused: Not on file    Forced sexual activity: Not on file  Other Topics Concern  . 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 UTERINE FIBROID SURGERY  [id number]    Family History  Problem Relation Age of Onset  . Arthritis Paternal Grandmother   . Arthritis Maternal Grandmother   . Rheum arthritis Paternal Grandfather   . Arthritis Father   . Breast cancer Maternal Aunt   . Skin cancer Maternal Aunt   .  Hyperlipidemia Father   . Heart disease Maternal Grandmother   . Hypertension Maternal Grandmother     Allergies  Allergen Reactions  . Sulfa Antibiotics Hives    Current Outpatient Medications on File Prior to Visit  Medication Sig Dispense Refill  . fexofenadine (ALLEGRA) 180 MG tablet Take 180 mg by mouth daily.     No current facility-administered medications on file prior to visit.     BP 112/72   Temp 97.7 F (36.5 C) (Oral)   Wt 151 lb (68.5 kg)   BMI 23.30 kg/m       Objective:   Physical Exam  Constitutional: She is oriented to person, place, and time. She appears well-developed and well-nourished. No distress.  Cardiovascular: Normal rate, regular rhythm, normal heart sounds and intact distal pulses. Exam reveals no gallop and no friction rub.  No murmur heard. Musculoskeletal: Normal range of motion. She exhibits tenderness. She exhibits no edema.  Muscles spasm noted in left lower back. No  spinal tenderness  Neurological: She is alert and oriented to person, place, and time.  Skin: Skin is warm. No rash noted. She is not diaphoretic. No erythema. No pallor.  Psychiatric: She has a normal mood and affect. Her behavior is normal. Thought content normal.  Nursing note and vitals reviewed.     Assessment & Plan:  1. Muscle spasm - cyclobenzaprine (FLEXERIL) 10 MG tablet; Take 1 tablet (10 mg total) by mouth 3 (three) times daily as needed for muscle spasms.  Dispense: 30 tablet; Refill: 0 - predniSONE (DELTASONE) 10 MG tablet; 40 mg x 3 days, 20 mg x 3 days, 10 mg x 3 days  Dispense: 21 tablet; Refill: 0 - Follow up as needed  Dorothyann Peng, NP

## 2017-05-18 NOTE — Telephone Encounter (Signed)
Pt. Reports she "hurt her back getting into my daughter's car yesterday afternoon." Hurts on the lower left side. States she got up early this morning "I think I fainted - I woke up on the floor.My husband got me up and back to bed." States she can not get comfortable. Ibu helped a little. Appointment made for today.Also reports she had some urinary frequency yesterday as well.  Reason for Disposition . [1] MODERATE back pain (e.g., interferes with normal activities) AND [2] present > 3 days  Answer Assessment - Initial Assessment Questions 1. ONSET: "When did the pain begin?"      Yesterday 2. LOCATION: "Where does it hurt?" (upper, mid or lower back)     Lower left back 3. SEVERITY: "How bad is the pain?"  (e.g., Scale 1-10; mild, moderate, or severe)   - MILD (1-3): doesn't interfere with normal activities    - MODERATE (4-7): interferes with normal activities or awakens from sleep    - SEVERE (8-10): excruciating pain, unable to do any normal activities      7 4. PATTERN: "Is the pain constant?" (e.g., yes, no; constant, intermittent)      Constant - can not get comfortagle 5. RADIATION: "Does the pain shoot into your legs or elsewhere?"     No 6. CAUSE:  "What do you think is causing the back pain?"      Pulled muscle getting in the car. 7. BACK OVERUSE:  "Any recent lifting of heavy objects, strenuous work or exercise?"     No 8. MEDICATIONS: "What have you taken so far for the pain?" (e.g., nothing, acetaminophen, NSAIDS)     Ibu helped. 9. NEUROLOGIC SYMPTOMS: "Do you have any weakness, numbness, or problems with bowel/bladder control?"     Hip on the left is sore 10. OTHER SYMPTOMS: "Do you have any other symptoms?" (e.g., fever, abdominal pain, burning with urination, blood in urine)       No 11. PREGNANCY: "Is there any chance you are pregnant?" (e.g., yes, no; LMP)       No  Protocols used: BACK PAIN-A-AH

## 2017-05-22 ENCOUNTER — Telehealth: Payer: Self-pay | Admitting: Family Medicine

## 2017-05-22 NOTE — Telephone Encounter (Signed)
Copied from [place]. Topic: Quick Communication - See Telephone Encounter >> May 22, 2017  8:55 AM Oneta Rack wrote:  Relation to pt: self Call back number: [phone] Pharmacy:  [id number] - [place], [place] - [address] [phone] (Phone) [phone] (Fax)     Reason for call:  Patient last seen 05/18/17 by Tommi Rumps due to muscle spasm and states is it okay if she takes ibuprofen in addition to the medication prescribed, stating symptoms have not improved, please advise

## 2017-05-22 NOTE — Telephone Encounter (Signed)
I would not take antiinflammatories (ibuprofen, aleve, etc) with the prednisone.

## 2017-05-22 NOTE — Telephone Encounter (Signed)
I called the pt and informed her of the message below

## 2017-05-31 ENCOUNTER — Ambulatory Visit: Payer: Self-pay | Admitting: Family Medicine

## 2017-06-18 ENCOUNTER — Ambulatory Visit: Payer: Self-pay | Admitting: Psychology

## 2017-07-04 DIAGNOSIS — D1801 Hemangioma of skin and subcutaneous tissue: Secondary | ICD-10-CM | POA: Diagnosis not present

## 2017-07-04 DIAGNOSIS — L821 Other seborrheic keratosis: Secondary | ICD-10-CM | POA: Diagnosis not present

## 2017-07-04 DIAGNOSIS — B078 Other viral warts: Secondary | ICD-10-CM | POA: Diagnosis not present

## 2017-07-04 DIAGNOSIS — Z86018 Personal history of other benign neoplasm: Secondary | ICD-10-CM | POA: Diagnosis not present

## 2017-07-04 DIAGNOSIS — D225 Melanocytic nevi of trunk: Secondary | ICD-10-CM | POA: Diagnosis not present

## 2017-09-17 DIAGNOSIS — M5136 Other intervertebral disc degeneration, lumbar region: Secondary | ICD-10-CM | POA: Diagnosis not present

## 2017-09-17 DIAGNOSIS — M25552 Pain in left hip: Secondary | ICD-10-CM | POA: Diagnosis not present

## 2017-11-30 ENCOUNTER — Telehealth: Payer: BLUE CROSS/BLUE SHIELD | Admitting: Nurse Practitioner

## 2017-11-30 ENCOUNTER — Ambulatory Visit: Payer: Self-pay | Admitting: *Deleted

## 2017-11-30 DIAGNOSIS — N3000 Acute cystitis without hematuria: Secondary | ICD-10-CM

## 2017-11-30 MED ORDER — NITROFURANTOIN MONOHYD MACRO 100 MG PO CAPS: 100.0000 mg | ORAL_CAPSULE | Freq: Two times a day (BID) | ORAL | 0 refills | Status: DC

## 2017-11-30 NOTE — Telephone Encounter (Signed)
Pt states since last Friday she  been experiencing urinary frequency and feels pressure with urinating. Pt states she has a little urgency and noticed that her urine was cloudy. Pt states she has been drinking a lot of water and cranberry juice to help with symptoms.Pt also states she has experienced a belly ache but thinks that may be related to the food she has been eating since being on vacation. Pt denies any fever, blood in urine or flank pain. Pt advised that she would need to be evaluated in the next 24 hours for current symptoms. Pt is currently at [facility] on vacation and will not return until Monday. Pt states that the Urgent Care closes around 4-4:30 pm there and is not open on Saturday and Sunday. Pt advised that she could try to do an e-visit through through [place] since the Urgent Care was closed. Pt sent the link in order to set up MyChart. Pt advised that she could also try AZO over the counter to see if this will help with current symptoms. Pt advised to call office to schedule an appt when she returns from vacation on Monday if symptoms continue. Pt verbalized understanding.  Reason for Disposition . Urinating more frequently than usual (i.e., frequency)  Answer Assessment - Initial Assessment Questions 1. SYMPTOM: "What's the main symptom you're concerned about?" (e.g., frequency, incontinence)     Urinary freqeuncy and pressure in pelvic area 2. ONSET: "When did the frequency  start?"     Last Friday 3. PAIN: "Is there any pain?" If so, ask: "How bad is it?" (Scale: 1-10; mild, moderate, severe)     Pressure with urinating 4. CAUSE: "What do you think is causing the symptoms?"     Possibly a urinary tract infection 5. OTHER SYMPTOMS: "Do you have any other symptoms?" (e.g., fever, flank pain, blood in urine, pain with urination)    abd pain- upper part of stomach upset, nausea on yesterday 6. PREGNANCY: "Is there any chance you are pregnant?" "When was your last menstrual  period?"     Last cycle, later than normal 2 weeks ago and husband has had a vasectomy  Protocols used: URINARY [facility]

## 2017-11-30 NOTE — Progress Notes (Signed)

## 2018-02-18 DIAGNOSIS — R87619 Unspecified abnormal cytological findings in specimens from cervix uteri: Secondary | ICD-10-CM | POA: Insufficient documentation

## 2018-02-18 DIAGNOSIS — K589 Irritable bowel syndrome without diarrhea: Secondary | ICD-10-CM | POA: Insufficient documentation

## 2018-02-20 DIAGNOSIS — Z1231 Encounter for screening mammogram for malignant neoplasm of breast: Secondary | ICD-10-CM | POA: Diagnosis not present

## 2018-02-20 DIAGNOSIS — Z01419 Encounter for gynecological examination (general) (routine) without abnormal findings: Secondary | ICD-10-CM | POA: Diagnosis not present

## 2018-02-20 DIAGNOSIS — Z6822 Body mass index (BMI) 22.0-22.9, adult: Secondary | ICD-10-CM | POA: Diagnosis not present

## 2018-03-05 ENCOUNTER — Ambulatory Visit: Payer: Self-pay | Admitting: Family Medicine

## 2018-03-11 DIAGNOSIS — N950 Postmenopausal bleeding: Secondary | ICD-10-CM | POA: Diagnosis not present

## 2018-03-25 DIAGNOSIS — L700 Acne vulgaris: Secondary | ICD-10-CM | POA: Diagnosis not present

## 2018-03-30 DIAGNOSIS — R300 Dysuria: Secondary | ICD-10-CM | POA: Diagnosis not present

## 2018-03-30 DIAGNOSIS — N3000 Acute cystitis without hematuria: Secondary | ICD-10-CM | POA: Diagnosis not present

## 2018-07-17 DIAGNOSIS — L814 Other melanin hyperpigmentation: Secondary | ICD-10-CM | POA: Diagnosis not present

## 2018-07-17 DIAGNOSIS — Z86018 Personal history of other benign neoplasm: Secondary | ICD-10-CM | POA: Diagnosis not present

## 2018-07-17 DIAGNOSIS — L700 Acne vulgaris: Secondary | ICD-10-CM | POA: Diagnosis not present

## 2018-07-17 DIAGNOSIS — D225 Melanocytic nevi of trunk: Secondary | ICD-10-CM | POA: Diagnosis not present

## 2018-07-17 DIAGNOSIS — Z79899 Other long term (current) drug therapy: Secondary | ICD-10-CM | POA: Diagnosis not present

## 2018-07-17 DIAGNOSIS — L821 Other seborrheic keratosis: Secondary | ICD-10-CM | POA: Diagnosis not present

## 2018-07-28 DIAGNOSIS — Z1159 Encounter for screening for other viral diseases: Secondary | ICD-10-CM | POA: Diagnosis not present

## 2018-09-03 ENCOUNTER — Other Ambulatory Visit: Payer: Self-pay | Admitting: Obstetrics and Gynecology

## 2018-09-03 ENCOUNTER — Other Ambulatory Visit: Payer: Self-pay

## 2018-09-03 ENCOUNTER — Ambulatory Visit
Admission: RE | Admit: 2018-09-03 | Discharge: 2018-09-03 | Disposition: A | Discharge status: Home / Self Care | Payer: BLUE CROSS/BLUE SHIELD | Source: Ambulatory Visit | Attending: Obstetrics and Gynecology | Admitting: Obstetrics and Gynecology

## 2018-09-03 ENCOUNTER — Ambulatory Visit
Admission: RE | Admit: 2018-09-03 | Discharge: 2018-09-03 | Disposition: A | Discharge status: Home / Self Care | Payer: BC Managed Care – PPO | Source: Ambulatory Visit | Attending: Obstetrics and Gynecology | Admitting: Obstetrics and Gynecology

## 2018-09-03 DIAGNOSIS — N6002 Solitary cyst of left breast: Secondary | ICD-10-CM

## 2018-09-03 DIAGNOSIS — N6012 Diffuse cystic mastopathy of left breast: Secondary | ICD-10-CM | POA: Diagnosis not present

## 2018-09-03 DIAGNOSIS — N6459 Other signs and symptoms in breast: Secondary | ICD-10-CM | POA: Diagnosis not present

## 2018-09-03 DIAGNOSIS — R922 Inconclusive mammogram: Secondary | ICD-10-CM | POA: Diagnosis not present

## 2018-09-26 ENCOUNTER — Other Ambulatory Visit: Payer: Self-pay | Admitting: Obstetrics and Gynecology

## 2018-09-26 DIAGNOSIS — Z803 Family history of malignant neoplasm of breast: Secondary | ICD-10-CM

## 2018-10-29 ENCOUNTER — Other Ambulatory Visit: Payer: Self-pay

## 2018-10-29 ENCOUNTER — Ambulatory Visit
Admission: RE | Admit: 2018-10-29 | Discharge: 2018-10-29 | Disposition: A | Discharge status: Home / Self Care | Payer: Self-pay | Source: Ambulatory Visit | Attending: Obstetrics and Gynecology | Admitting: Obstetrics and Gynecology

## 2018-10-29 DIAGNOSIS — Z803 Family history of malignant neoplasm of breast: Secondary | ICD-10-CM

## 2018-10-29 MED ORDER — GADOBUTROL 1 MMOL/ML IV SOLN
5.0000 mL | Freq: Once | INTRAVENOUS | Status: AC | PRN
  Administered 2018-10-29: 5 mL via INTRAVENOUS

## 2019-01-13 ENCOUNTER — Telehealth: Payer: Self-pay

## 2019-01-13 NOTE — Telephone Encounter (Signed)
Copied from [place] [phone]. Topic: Quick Communication - See Telephone Encounter >> Jan 13, 2019 10:54 AM Audrey Cox wrote: Reason for CRM: Pt is wanting to transfer to Dr Jonni Sanger since Dr Maudie Mercury is no longer doing appointments in office. She is needing CPE set up

## 2019-01-13 NOTE — Telephone Encounter (Signed)
I left a detailed message at the pts cell number to call Dr Audrey Cox office for an appt as I cannot schedule visits for their office.

## 2019-01-15 DIAGNOSIS — F419 Anxiety disorder, unspecified: Secondary | ICD-10-CM | POA: Diagnosis not present

## 2019-01-15 DIAGNOSIS — R300 Dysuria: Secondary | ICD-10-CM | POA: Diagnosis not present

## 2019-01-15 DIAGNOSIS — R309 Painful micturition, unspecified: Secondary | ICD-10-CM | POA: Diagnosis not present

## 2019-01-21 DIAGNOSIS — N926 Irregular menstruation, unspecified: Secondary | ICD-10-CM | POA: Diagnosis not present

## 2019-01-21 DIAGNOSIS — Z3202 Encounter for pregnancy test, result negative: Secondary | ICD-10-CM | POA: Diagnosis not present

## 2019-01-21 DIAGNOSIS — N760 Acute vaginitis: Secondary | ICD-10-CM | POA: Diagnosis not present

## 2019-01-21 DIAGNOSIS — R309 Painful micturition, unspecified: Secondary | ICD-10-CM | POA: Diagnosis not present

## 2019-01-21 DIAGNOSIS — R300 Dysuria: Secondary | ICD-10-CM | POA: Diagnosis not present

## 2019-01-21 DIAGNOSIS — R32 Unspecified urinary incontinence: Secondary | ICD-10-CM | POA: Diagnosis not present

## 2019-02-28 DIAGNOSIS — Z20828 Contact with and (suspected) exposure to other viral communicable diseases: Secondary | ICD-10-CM | POA: Diagnosis not present

## 2019-02-28 DIAGNOSIS — Z03818 Encounter for observation for suspected exposure to other biological agents ruled out: Secondary | ICD-10-CM | POA: Diagnosis not present

## 2019-03-12 ENCOUNTER — Other Ambulatory Visit: Payer: BC Managed Care – PPO

## 2019-04-09 ENCOUNTER — Encounter: Payer: No Typology Code available for payment source | Admitting: Family Medicine

## 2019-04-30 ENCOUNTER — Encounter: Payer: Self-pay | Admitting: Family Medicine

## 2019-04-30 ENCOUNTER — Ambulatory Visit (INDEPENDENT_AMBULATORY_CARE_PROVIDER_SITE_OTHER): Payer: BC Managed Care – PPO | Admitting: Family Medicine

## 2019-04-30 ENCOUNTER — Other Ambulatory Visit: Payer: Self-pay

## 2019-04-30 ENCOUNTER — Encounter: Payer: Self-pay | Admitting: Internal Medicine

## 2019-04-30 VITALS — BP 102/66 | HR 86 | Temp 97.4°F | Ht 67.5 in | Wt 150.0 lb

## 2019-04-30 DIAGNOSIS — N951 Menopausal and female climacteric states: Secondary | ICD-10-CM | POA: Insufficient documentation

## 2019-04-30 DIAGNOSIS — Z1212 Encounter for screening for malignant neoplasm of rectum: Secondary | ICD-10-CM

## 2019-04-30 DIAGNOSIS — Z808 Family history of malignant neoplasm of other organs or systems: Secondary | ICD-10-CM

## 2019-04-30 DIAGNOSIS — Z1211 Encounter for screening for malignant neoplasm of colon: Secondary | ICD-10-CM | POA: Diagnosis not present

## 2019-04-30 DIAGNOSIS — Z0000 Encounter for general adult medical examination without abnormal findings: Secondary | ICD-10-CM | POA: Diagnosis not present

## 2019-04-30 DIAGNOSIS — Z23 Encounter for immunization: Secondary | ICD-10-CM

## 2019-04-30 DIAGNOSIS — R41840 Attention and concentration deficit: Secondary | ICD-10-CM

## 2019-04-30 DIAGNOSIS — Z8659 Personal history of other mental and behavioral disorders: Secondary | ICD-10-CM | POA: Diagnosis not present

## 2019-04-30 DIAGNOSIS — F419 Anxiety disorder, unspecified: Secondary | ICD-10-CM | POA: Diagnosis not present

## 2019-04-30 HISTORY — DX: Personal history of other mental and behavioral disorders: Z86.59

## 2019-04-30 HISTORY — DX: Family history of malignant neoplasm of other organs or systems: Z80.8

## 2019-04-30 LAB — CBC WITH DIFFERENTIAL/PLATELET
Basophils Absolute: 0.1 10*3/uL (ref 0.0–0.1)
Basophils Relative: 0.7 % (ref 0.0–3.0)
Eosinophils Absolute: 0.2 10*3/uL (ref 0.0–0.7)
Eosinophils Relative: 1.8 % (ref 0.0–5.0)
HCT: 43 % (ref 36.0–46.0)
Hemoglobin: 14.5 g/dL (ref 12.0–15.0)
Lymphocytes Relative: 18 % (ref 12.0–46.0)
Lymphs Abs: 1.7 10*3/uL (ref 0.7–4.0)
MCHC: 33.8 g/dL (ref 30.0–36.0)
MCV: 90 fl (ref 78.0–100.0)
Monocytes Absolute: 0.5 10*3/uL (ref 0.1–1.0)
Monocytes Relative: 5 % (ref 3.0–12.0)
Neutro Abs: 7 10*3/uL (ref 1.4–7.7)
Neutrophils Relative %: 74.5 % (ref 43.0–77.0)
Platelets: 357 10*3/uL (ref 150.0–400.0)
RBC: 4.77 Mil/uL (ref 3.87–5.11)
RDW: 13.1 % (ref 11.5–15.5)
WBC: 9.5 10*3/uL (ref 4.0–10.5)

## 2019-04-30 LAB — LIPID PANEL
Cholesterol: 210 mg/dL — ABNORMAL HIGH (ref 0–200)
HDL: 58.8 mg/dL (ref >39.00)
LDL Cholesterol: 127 mg/dL — ABNORMAL HIGH (ref 0–99)
NonHDL: 151.39
Total CHOL/HDL Ratio: 4
Triglycerides: 121 mg/dL (ref 0.0–149.0)
VLDL: 24.2 mg/dL (ref 0.0–40.0)

## 2019-04-30 LAB — COMPREHENSIVE METABOLIC PANEL
ALT: 17 U/L (ref 0–35)
AST: 17 U/L (ref 0–37)
Albumin: 4.6 g/dL (ref 3.5–5.2)
Alkaline Phosphatase: 78 U/L (ref 39–117)
BUN: 20 mg/dL (ref 6–23)
CO2: 28 mEq/L (ref 19–32)
Calcium: 9.6 mg/dL (ref 8.4–10.5)
Chloride: 102 mEq/L (ref 96–112)
Creatinine, Ser: 0.7 mg/dL (ref 0.40–1.20)
GFR: 88.13 mL/min (ref >60.00)
Glucose, Bld: 99 mg/dL (ref 70–99)
Potassium: 4.7 mEq/L (ref 3.5–5.1)
Sodium: 139 mEq/L (ref 135–145)
Total Bilirubin: 0.4 mg/dL (ref 0.2–1.2)
Total Protein: 7.3 g/dL (ref 6.0–8.3)

## 2019-04-30 LAB — TSH: TSH: 0.85 u[IU]/mL (ref 0.35–4.50)

## 2019-04-30 NOTE — Addendum Note (Signed)
Addended bySerita Sheller on: 04/30/2019 09:19 AM   Modules accepted: Orders

## 2019-04-30 NOTE — Progress Notes (Signed)
Subjective  Chief Complaint  Patient presents with  . Transitions Of Care    TOC from Dr. Maudie Mercury. requesting  cpe. last one over 2 years ago. discuss colonoscopy  . Anxiety    takes xanax prn and would to discuss menopause    HPI: Audrey Cox is a 54 y.o. female who presents to [facility] [place] today for a Female Wellness Visit.  She also has the concerns and/or needs as listed above in the chief complaint. These will be addressed in addition to the Health Maintenance Visit.   Wellness Visit: annual visit with health maintenance review and exam without Pap   HM: pleasant 72 yo married mother of 2 grown children, 25 and 51, presents for TOC cpe. Lives healthy lifestyle; sees Dr. Matthew Saras next month for female wellness visit. Due mammogram, tdap, and CRC screen.  Chronic disease management visit and/or acute problem visit:  Anxiety: "feels stressed all the time". Is struggling a bit with her son who is having some mood problems; this worries her greatly. As well has stresses over some properties she and her family owns and some family issues. Has h/o mild depression over the last few years but feels she is not depressed at this time. Of note, she failed both trials of SSRIs at that time. She does use rare xanax for times when she feels overwhelmed. She reports her mind is always all over the place. + depression in mother and possible personality d/o in sister. No FH of bipolar disorder. She does not have panic attacks although she does describe some panic sxs: chest tightness, can't breathe.  Attention: has been told she likely has ADD by teachers in the past. Knows she has problems with focus and attention. Denies hyperactivity. Never has been treated. She is skeptical about the medications.   Perimenopausal sxs: irregular menses, interrupted sleep. Sees GYN next month.   Assessment  1. Annual physical exam   2. Anxiety   3. Screening for colorectal cancer   4.  Attention and concentration deficit   5. Perimenopausal symptoms   6. History of depression      Plan  Female Wellness Visit:  Age appropriate Health Maintenance and Prevention measures were discussed with patient. Included topics are cancer screening recommendations, ways to keep healthy (see AVS) including dietary and exercise recommendations, regular eye and dental care, use of seat belts, and avoidance of moderate alcohol use and tobacco use. Mammogram next month at GYN office. Reports paps are up to date. Will request records. Refer to GI for CRC screen.   BMI: discussed patient's BMI and encouraged positive lifestyle modifications to help get to or maintain a target BMI.  HM needs and immunizations were addressed and ordered. See below for orders. See HM and immunization section for updates. tdap today.   Routine labs and screening tests ordered including cmp, cbc and lipids where appropriate.  Discussed recommendations regarding Vit D and calcium supplementation (see AVS)  Chronic disease f/u and/or acute problem visit: (deemed necessary to be done in addition to the wellness visit):  Anxiety state:  ? GAD vs adjustment disorder vs stress reaction vs other. Pt to continue with rare xanax use and to monitor her sxs more closely at home. She will journal. She will return if needs more help with dx or treatment options.   Attention difficulites: ? ADD; discussed possibility and consideration of medication trial. She will think about the effects of her sxs more and return  if needed. Discussed how attention problems can exacerbate anxiety sxs.   Periomenopause: to discuss in more detail with GYN.   Follow up: Return in about 1 year (around 04/29/2020) for complete physical.   Orders Placed This Encounter  Procedures  . CBC with Differential/Platelet  . Comprehensive metabolic panel  . Lipid panel  . HIV Antibody (routine testing w rflx)  . TSH  . Ambulatory referral to  Gastroenterology   No orders of the defined types were placed in this encounter.     Lifestyle: Body mass index is 23.15 kg/m. Wt Readings from Last 3 Encounters:  04/30/19 150 lb (68 kg)  05/18/17 151 lb (68.5 kg)  03/01/17 151 lb 3.2 oz (68.6 kg)     Patient Active Problem List   Diagnosis Date Noted  . Perimenopausal symptoms 04/30/2019  . Attention and concentration deficit 04/30/2019  . History of depression 04/30/2019    Mild; didn't tolerate SSRIs x 2   . Abnormal cervical Papanicolaou smear 02/18/2018  . Irritable bowel syndrome 02/18/2018  . Anxiety state 11/17/2014  . Rhinitis, allergic 11/17/2014   Health Maintenance  Topic Date Due  . TETANUS/TDAP  Never done  . COLONOSCOPY  Never done  . INFLUENZA VACCINE  05/07/2019 (Originally 09/07/2018)  . MAMMOGRAM  05/28/2019 (Originally 02/04/2015)  . HIV Screening  11/16/2024 (Originally 01/23/1983)  . PAP SMEAR-Modifier  02/07/2020   Immunization History  Administered Date(s) Administered  . Influenza,inj,Quad PF,6+ Mos 11/17/2014, 12/21/2016   We updated and reviewed the patient's past history in detail and it is documented below. Allergies: Patient  reports current alcohol use. Past Medical History Patient  has a past medical history of Allergy, Arthritis, Blood transfusion without reported diagnosis, Heart murmur, and History of depression (04/30/2019). Past Surgical History Patient  has a past surgical history that includes Uterine fibroid surgery [id number]). Social History   Socioeconomic History  . Marital status: Married    Spouse name: Not on file  . Number of children: 2  . Years of education: Not on file  . Highest education level: Not on file  Occupational History  . Occupation: housewife  Tobacco Use  . Smoking status: Never Smoker  . Smokeless tobacco: Never Used  Substance and Sexual Activity  . Alcohol use: Yes    Alcohol/week: 0.0 standard drinks    Comment: rare  . Drug use: Never    . Sexual activity: Yes    Birth control/protection: Post-menopausal  Other Topics Concern  . 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 Difficulty of Paying Living Expenses:   Food Insecurity:   . Worried About Charity fundraiser in the Last Year:   . Arboriculturist in the Last Year:   Transportation Needs:   . Film/video editor (Medical):   Marland Kitchen Lack of Transportation (Non-Medical):   Physical Activity:   . Days of Exercise per Week:   . Minutes of Exercise per Session:   Stress:   . Feeling of Stress :   Social Connections:   . Frequency of Communication with Friends and Family:   .  Frequency of Social Gatherings with Friends and Family:   . Attends Religious Services:   . Active Member of Clubs or Organizations:   . Attends Archivist Meetings:   Marland Kitchen Marital Status:    Family History  Problem Relation Age of Onset  . Arthritis Paternal Grandmother   . Arthritis Maternal Grandmother   . Heart disease Maternal Grandmother   . Hypertension Maternal Grandmother   . Melanoma Maternal Grandmother   . Rheum arthritis Paternal Grandfather   . Arthritis Father   . Hyperlipidemia Father   . Breast cancer Maternal Aunt   . Melanoma Maternal Aunt   . Depression Mother   . Mood Disorder Sister   . Healthy Daughter   . Mood Disorder Son     Review of Systems: Constitutional: negative for fever or malaise Ophthalmic: negative for photophobia, double vision or loss of vision Cardiovascular: negative for chest pain, dyspnea on exertion, or new LE swelling Respiratory: negative for SOB or persistent cough Gastrointestinal: negative for abdominal pain, change in bowel  habits or melena Genitourinary: negative for dysuria or gross hematuria, no abnormal uterine bleeding or disharge Musculoskeletal: negative for new gait disturbance or muscular weakness Integumentary: negative for new or persistent rashes, no breast lumps Neurological: negative for TIA or stroke symptoms Psychiatric: negative for SI or delusions Allergic/Immunologic: negative for hives  Patient Care Team    Relationship Specialty Notifications Start End  Leamon Arnt, MD PCP - General Family Medicine  04/30/19   Molli Posey, MD Consulting Physician Obstetrics and Gynecology  04/30/19     Objective  Vitals: BP 102/66 (BP Location: Left Arm, Patient Position: Sitting, Cuff Size: Normal)   Pulse 86   Temp (!) 97.4 F (36.3 C) (Temporal)   Ht 5' 7.5" (1.715 m)   Wt 150 lb (68 kg)   LMP 04/26/2019   SpO2 97%   BMI 23.15 kg/m  General:  Well developed, well nourished, no acute distress  Psych:  Alert and orientedx3,normal mood and affect HEENT:  Normocephalic, atraumatic, non-icteric sclera, PERRL, supple neck without adenopathy, mass or thyromegaly Cardiovascular:  Normal S1, S2, RRR without gallop, rub or murmur Respiratory:  Good breath sounds bilaterally, CTAB with normal respiratory effort Gastrointestinal: normal bowel sounds, soft, non-tender, no noted masses. No HSM MSK: no deformities, contusions. Joints are without erythema or swelling.  Skin:  Warm, no rashes or suspicious lesions noted, multile moles Neurologic:    Mental status is normal. Gross motor and sensory exams are normal. Normal gait. No tremor     Commons side effects, risks, benefits, and alternatives for medications and treatment plan prescribed today were discussed, and the patient expressed understanding of the given instructions. Patient is instructed to call or message via MyChart if he/she has any questions or concerns regarding our treatment plan. No barriers to understanding were identified. We  discussed Red Flag symptoms and signs in detail. Patient expressed understanding regarding what to do in case of urgent or emergency type symptoms.   Medication list was reconciled, printed and provided to the patient in AVS. Patient instructions and summary information was reviewed with the patient as documented in the AVS. This note was prepared with assistance of Dragon voice recognition software. Occasional wrong-word or sound-a-like substitutions may have occurred due to the inherent limitations of voice recognition software  This visit occurred during the SARS-CoV-2 public health emergency.  Safety protocols were in place, including screening questions prior to the visit, additional usage of staff PPE, and  extensive cleaning of exam room while observing appropriate contact time as indicated for disinfecting solutions.

## 2019-04-30 NOTE — Patient Instructions (Addendum)
Please return in 12 months for your annual complete physical; please come fasting. Sooner if would like to discuss attention and anxiety more.   I will release your lab results to you on your MyChart account with further instructions. Please reply with any questions.  Please have Dr. Matthew Saras send me your pap smear results/records and notes.  Today you were given your Tdap vaccination. This is good for 10 years. We recommend getting the flu vaccine in September or October.   We will call you with information regarding your referral appointment. [facility] for colonoscopy.  If you do not hear from [place] within the next 2 weeks, please let me know. It can take 1-2 weeks to get appointments set up with the specialists.    It was a pleasure meeting you today! Thank you for choosing [place] to meet your healthcare needs! I truly look forward to working with you. If you have any questions or concerns, please send me a message via Mychart or call the office at [phone].  Please do these things to maintain good health!   Exercise at least 30-45 minutes a day,  4-5 days a week.   Eat a low-fat diet with lots of fruits and vegetables, up to 7-9 servings per day.  Drink plenty of water daily. Try to drink 8 8oz glasses per day.  Seatbelts can save your life. Always wear your seatbelt.  Place Smoke Detectors on every level of your home and check batteries every year.  Schedule an appointment with an eye doctor for an eye exam every 1-2 years  Safe sex - use condoms to protect yourself from STDs if you could be exposed to these types of infections. Use birth control if you do not want to become pregnant and are sexually active.  Avoid heavy alcohol use. If you drink, keep it to less than 2 drinks/day and not every day.  [place].  Choose someone you trust that could speak for you if you became unable to speak for yourself.  Depression is common in our stressful world.If you're  feeling down or losing interest in things you normally enjoy, please come in for a visit.  If anyone is threatening or hurting you, please get help. Physical or Emotional Violence is never OK.

## 2019-05-01 LAB — HIV ANTIBODY (ROUTINE TESTING W REFLEX): HIV 1&2 Ab, 4th Generation: NONREACTIVE

## 2019-05-21 ENCOUNTER — Ambulatory Visit (AMBULATORY_SURGERY_CENTER): Payer: Self-pay | Admitting: *Deleted

## 2019-05-21 ENCOUNTER — Other Ambulatory Visit: Payer: Self-pay

## 2019-05-21 VITALS — Temp 97.5°F | Ht 67.5 in | Wt 150.0 lb

## 2019-05-21 DIAGNOSIS — Z1211 Encounter for screening for malignant neoplasm of colon: Secondary | ICD-10-CM

## 2019-05-21 DIAGNOSIS — Z01818 Encounter for other preprocedural examination: Secondary | ICD-10-CM

## 2019-05-21 MED ORDER — SUTAB 1479-225-188 MG PO TABS: 1.0000 | ORAL_TABLET | ORAL | 0 refills | Status: DC

## 2019-05-21 NOTE — Progress Notes (Signed)
Patient is here in-person for PV. Patient denies any allergies to eggs or soy. Patient denies any problems with anesthesia/sedation. Patient denies any oxygen use at home. Patient denies taking any diet/weight loss medications or blood thinners. Patient is not being treated for MRSA or C-diff. EMMI education declined by the pt.  COVID-19 screening test is on 4/26, the pt is aware.  Patient is aware of our care-partner policy and 0000000 safety protocol.   Prep Prescription coupon given to the patient.

## 2019-06-02 ENCOUNTER — Encounter: Payer: Self-pay | Admitting: Internal Medicine

## 2019-06-02 ENCOUNTER — Other Ambulatory Visit: Payer: Self-pay

## 2019-06-02 ENCOUNTER — Other Ambulatory Visit: Payer: Self-pay | Admitting: Internal Medicine

## 2019-06-02 ENCOUNTER — Ambulatory Visit (INDEPENDENT_AMBULATORY_CARE_PROVIDER_SITE_OTHER): Payer: BC Managed Care – PPO

## 2019-06-02 DIAGNOSIS — Z1159 Encounter for screening for other viral diseases: Secondary | ICD-10-CM

## 2019-06-03 LAB — SARS CORONAVIRUS 2 (TAT 6-24 HRS): SARS Coronavirus 2: NEGATIVE

## 2019-06-04 ENCOUNTER — Ambulatory Visit (AMBULATORY_SURGERY_CENTER): Payer: BC Managed Care – PPO | Admitting: Internal Medicine

## 2019-06-04 ENCOUNTER — Encounter: Payer: Self-pay | Admitting: Internal Medicine

## 2019-06-04 ENCOUNTER — Other Ambulatory Visit: Payer: Self-pay

## 2019-06-04 VITALS — BP 111/75 | HR 83 | Temp 97.3°F | Resp 17 | Ht 67.5 in | Wt 150.0 lb

## 2019-06-04 DIAGNOSIS — Z1211 Encounter for screening for malignant neoplasm of colon: Secondary | ICD-10-CM | POA: Diagnosis not present

## 2019-06-04 DIAGNOSIS — D125 Benign neoplasm of sigmoid colon: Secondary | ICD-10-CM | POA: Diagnosis not present

## 2019-06-04 DIAGNOSIS — K635 Polyp of colon: Secondary | ICD-10-CM

## 2019-06-04 MED ORDER — SODIUM CHLORIDE 0.9 % IV SOLN: 500.0000 mL | Freq: Once | INTRAVENOUS | Status: DC

## 2019-06-04 NOTE — Progress Notes (Signed)
Called to room to assist during endoscopic procedure.  Patient ID and intended procedure confirmed with present staff. Received instructions for my participation in the procedure from the performing physician.  

## 2019-06-04 NOTE — Progress Notes (Signed)
LC- temp CW- vitals

## 2019-06-04 NOTE — Patient Instructions (Signed)
Please read handouts provided. Continue present medications. Await pathology results.   YOU HAD AN ENDOSCOPIC PROCEDURE TODAY AT THE [facility]:   Refer to the procedure report that was given to you for any specific questions about what was found during the examination.  If the procedure report does not answer your questions, please call your gastroenterologist to clarify.  If you requested that your care partner not be given the details of your procedure findings, then the procedure report has been included in a sealed envelope for you to review at your convenience later.  YOU SHOULD EXPECT: Some feelings of bloating in the abdomen. Passage of more gas than usual.  Walking can help get rid of the air that was put into your GI tract during the procedure and reduce the bloating. If you had a lower endoscopy (such as a colonoscopy or flexible sigmoidoscopy) you may notice spotting of blood in your stool or on the toilet paper. If you underwent a bowel prep for your procedure, you may not have a normal bowel movement for a few days.  Please Note:  You might notice some irritation and congestion in your nose or some drainage.  This is from the oxygen used during your procedure.  There is no need for concern and it should clear up in a day or so.  SYMPTOMS TO REPORT IMMEDIATELY:  Following lower endoscopy (colonoscopy or flexible sigmoidoscopy):  Excessive amounts of blood in the stool  Significant tenderness or worsening of abdominal pains  Swelling of the abdomen that is new, acute  Fever of 100F or higher   For urgent or emergent issues, a gastroenterologist can be reached at any hour by calling [phone]. Do not use MyChart messaging for urgent concerns.    DIET:  We do recommend a small meal at first, but then you may proceed to your regular diet.  Drink plenty of fluids but you should avoid alcoholic beverages for 24 hours.  ACTIVITY:  You should plan to take it easy  for the rest of today and you should NOT DRIVE or use heavy machinery until tomorrow (because of the sedation medicines used during the test).    FOLLOW UP: Our staff will call the number listed on your records 48-72 hours following your procedure to check on you and address any questions or concerns that you may have regarding the information given to you following your procedure. If we do not reach you, we will leave a message.  We will attempt to reach you two times.  During this call, we will ask if you have developed any symptoms of COVID 19. If you develop any symptoms (ie: fever, flu-like symptoms, shortness of breath, cough etc.) before then, please call [phone].  If you test positive for Covid 19 in the 2 weeks post procedure, please call and report this information to us.    If any biopsies were taken you will be contacted by phone or by letter within the next 1-3 weeks.  Please call us at [phone] if you have not heard about the biopsies in 3 weeks.    SIGNATURES/CONFIDENTIALITY: You and/or your care partner have signed paperwork which will be entered into your electronic medical record.  These signatures attest to the fact that that the information above on your After Visit Summary has been reviewed and is understood.  Full responsibility of the confidentiality of this discharge information lies with you and/or your care-partner.  

## 2019-06-04 NOTE — Op Note (Signed)
[facility] Patient Name: Asaiah Milhous Procedure Date: 06/04/2019 9:00 AM MRN: [id number] Endoscopist: Jerene Bears , MD Age: 54 Referring MD:  Date of Birth: 1965/07/16 Gender: Female Account #: [id number] Procedure:                Colonoscopy Indications:              Screening for colorectal malignant neoplasm, This                            is the patient's first colonoscopy Medicines:                Monitored Anesthesia Care Procedure:                Pre-Anesthesia Assessment:                           - Prior to the procedure, a History and Physical                            was performed, and patient medications and                            allergies were reviewed. The patient's tolerance of                            previous anesthesia was also reviewed. The risks                            and benefits of the procedure and the sedation                            options and risks were discussed with the patient.                            All questions were answered, and informed consent                            was obtained. Prior Anticoagulants: The patient has                            taken no previous anticoagulant or antiplatelet                            agents. ASA Grade Assessment: II - A patient with                            mild systemic disease. After reviewing the risks                            and benefits, the patient was deemed in                            satisfactory condition to undergo the procedure.  After obtaining informed consent, the colonoscope                            was passed under direct vision. Throughout the                            procedure, the patient's blood pressure, pulse, and                            oxygen saturations were monitored continuously. The                            Colonoscope was introduced through the anus and                            advanced to the terminal  ileum. The colonoscopy was                            performed without difficulty. The patient tolerated                            the procedure well. The quality of the bowel                            preparation was excellent. The terminal ileum,                            ileocecal valve, appendiceal orifice, and rectum                            were photographed. The bowel preparation used was                            SUPREP via split dose instruction. Scope In: 9:12:38 AM Scope Out: 9:28:57 AM Scope Withdrawal Time: 0 hours 11 minutes 14 seconds  Total Procedure Duration: 0 hours 16 minutes 19 seconds  Findings:                 The digital rectal exam was normal.                           The terminal ileum appeared normal.                           A 5 mm polyp was found in the sigmoid colon. The                            polyp was sessile. The polyp was removed with a                            cold snare. Resection and retrieval were complete.                           The exam was otherwise without abnormality on  direct and retroflexion views. Complications:            No immediate complications. Estimated Blood Loss:     Estimated blood loss: none. Impression:               - The examined portion of the ileum was normal.                           - One 5 mm polyp in the sigmoid colon, removed with                            a cold snare. Resected and retrieved.                           - The examination was otherwise normal on direct                            and retroflexion views. Recommendation:           - Patient has a contact number available for                            emergencies. The signs and symptoms of potential                            delayed complications were discussed with the                            patient. Return to normal activities tomorrow.                            Written discharge instructions were provided  to the                            patient.                           - Resume previous diet.                           - Continue present medications.                           - Await pathology results.                           - Repeat colonoscopy is recommended. The                            colonoscopy date will be determined after pathology                            results from today's exam become available for                            review. Jerene Bears, MD 06/04/2019 9:33:03 AM This report has been signed  electronically.

## 2019-06-04 NOTE — Progress Notes (Signed)
Report to PACU, RN, vss, BBS= Clear.  

## 2019-06-04 NOTE — Progress Notes (Signed)
Pt's states no medical or surgical changes since previsit or office visit. 

## 2019-06-06 ENCOUNTER — Telehealth: Payer: Self-pay

## 2019-06-06 NOTE — Telephone Encounter (Signed)
  Follow up Call-  Call back number 06/04/2019  Post procedure Call Back phone  # [phone]  Permission to leave phone message Yes  Some recent data might be hidden     Patient questions:  Do you have a fever, pain , or abdominal swelling? No. Pain Score  0 *  Have you tolerated food without any problems? Yes.    Have you been able to return to your normal activities? Yes.    Do you have any questions about your discharge instructions: Diet   No. Medications  No. Follow up visit  No.  Do you have questions or concerns about your Care? No.  Actions: * If pain score is 4 or above: No action needed, pain <4. 1. Have you developed a fever since your procedure? no  2.   Have you had an respiratory symptoms (SOB or cough) since your procedure? no  3.   Have you tested positive for COVID 19 since your procedure no  4.   Have you had any family members/close contacts diagnosed with the COVID 19 since your procedure?  no   If yes to any of these questions please route to Joylene John, RN and Erenest Rasher, RN

## 2019-06-06 NOTE — Telephone Encounter (Signed)
  Follow up Call-  Call back number 06/04/2019  Post procedure Call Back phone  # [phone]  Permission to leave phone message Yes  Some recent data might be hidden     Patient questions:  Do you have a fever, pain , or abdominal swelling? No. Pain Score  0 *  Have you tolerated food without any problems? Yes.    Have you been able to return to your normal activities? Yes.    Do you have any questions about your discharge instructions: Diet   No. Medications  No. Follow up visit  No.  Do you have questions or concerns about your Care? No.  Actions: * If pain score is 4 or above: No action needed, pain <4. 1. Have you developed a fever since your procedure? no  2.   Have you had an respiratory symptoms (SOB or cough) since your procedure? no  3.   Have you tested positive for COVID 19 since your procedure no  4.   Have you had any family members/close contacts diagnosed with the COVID 19 since your procedure?  no   If yes to any of these questions please route to Joylene John, RN and Erenest Rasher, RN

## 2019-06-09 ENCOUNTER — Encounter: Payer: Self-pay | Admitting: Internal Medicine

## 2019-06-09 NOTE — Telephone Encounter (Signed)
Charted in error, see initial note

## 2019-07-14 ENCOUNTER — Ambulatory Visit: Payer: BC Managed Care – PPO | Admitting: Family Medicine

## 2019-07-17 ENCOUNTER — Ambulatory Visit: Payer: BC Managed Care – PPO | Admitting: Family Medicine

## 2019-07-17 ENCOUNTER — Encounter: Payer: Self-pay | Admitting: Family Medicine

## 2019-07-17 ENCOUNTER — Other Ambulatory Visit: Payer: Self-pay

## 2019-07-17 VITALS — BP 124/70 | HR 90 | Temp 98.2°F | Resp 18 | Ht 68.0 in | Wt 151.0 lb

## 2019-07-17 DIAGNOSIS — F988 Other specified behavioral and emotional disorders with onset usually occurring in childhood and adolescence: Secondary | ICD-10-CM | POA: Diagnosis not present

## 2019-07-17 DIAGNOSIS — F39 Unspecified mood [affective] disorder: Secondary | ICD-10-CM | POA: Diagnosis not present

## 2019-07-17 DIAGNOSIS — D225 Melanocytic nevi of trunk: Secondary | ICD-10-CM | POA: Diagnosis not present

## 2019-07-17 DIAGNOSIS — Z86018 Personal history of other benign neoplasm: Secondary | ICD-10-CM | POA: Diagnosis not present

## 2019-07-17 DIAGNOSIS — L814 Other melanin hyperpigmentation: Secondary | ICD-10-CM | POA: Diagnosis not present

## 2019-07-17 DIAGNOSIS — L821 Other seborrheic keratosis: Secondary | ICD-10-CM | POA: Diagnosis not present

## 2019-07-17 MED ORDER — AMPHETAMINE-DEXTROAMPHET ER 10 MG PO CP24: 10.0000 mg | ORAL_CAPSULE | Freq: Every day | ORAL | 0 refills | Status: DC

## 2019-07-17 NOTE — Progress Notes (Signed)
Subjective  CC:  Chief Complaint  Patient presents with  . Anxiety    Always feels anxious, not able to focus. Concerns for ADHD and anxiety. Would like she to discuss her treatment options.     HPI: Audrey Cox is a 54 y.o. female who presents to the office today to address the problems listed above in the chief complaint, mood problems.  Feels a mess. Has chronic worry, anxiety, poor focus, "control issues", ocd issues and sadness. See last note. Has been treated with effexor and lexapro in past but didn't like side effects (weight gain and libido effects) and one made her feel "detached". However, things are getting worse. Skeptical about meds. Denies mania, panic, or impulsivity. Rare drinker.  Depression screen [facility] 2/9 07/17/2019 03/01/2017 12/21/2016  Decreased Interest 3 1 1   Down, Depressed, Hopeless 3 2 2   PHQ - 2 Score 6 3 3   Altered sleeping 3 0 0  Tired, decreased energy 3 1 2   Change in appetite 2 3 2   Feeling bad or failure about yourself  3 1 1   Trouble concentrating 3 3 2   Moving slowly or fidgety/restless 1 3 2   Suicidal thoughts 0 0 0  PHQ-9 Score 21 14 12   Difficult doing work/chores Very difficult - -   GAD 7 : Generalized Anxiety Score 07/17/2019  Nervous, Anxious, on Edge 3  Control/stop worrying 3  Worry too much - different things 3  Trouble relaxing 3  Restless 1  Easily annoyed or irritable 3  Afraid - awful might happen 3  Total GAD 7 Score 19  Anxiety Difficulty Very difficult    Assessment  1. Attention deficit disorder (ADD) without hyperactivity   2. Mood disorder ([place])      Plan    Possible Adult ADD: start adderall xr 10 trial. Close f/u  Mood disorder: complex: ? GAD/depression/OCD/bipolar or other. Failed SSRI's x 2. Start psychotherapy to help further clarify and help with coping skills; consider psych if needed.  Total time of encounter: 30 minutes total time of encounter, including 25 minutes spent in face-to-face patient  care. This time includes coordination of care and counseling regarding acute complaints, review of xray, plan of care. Remainder of non-face-to-face time involved reviewing chart documents/testing relevant to the patient encounter and documentation in the medical record.  Follow up: 4 weeks for recheck add and mood  No orders of the defined types were placed in this encounter.  Meds ordered this encounter  Medications  . amphetamine-dextroamphetamine (ADDERALL XR) 10 MG 24 hr capsule    Sig: Take 1 capsule (10 mg total) by mouth daily.    Dispense:  30 capsule    Refill:  0      I reviewed the patients updated PMH, FH, and SocHx.    Patient Active Problem List   Diagnosis Date Noted  . Perimenopausal symptoms 04/30/2019  . Attention and concentration deficit 04/30/2019  . History of depression 04/30/2019  . Family history of malignant melanoma 04/30/2019  . Abnormal cervical Papanicolaou smear 02/18/2018  . Irritable bowel syndrome 02/18/2018  . Anxiety state 11/17/2014  . Rhinitis, allergic 11/17/2014   Current Meds  Medication Sig  . ALPRAZolam (XANAX) 0.25 MG tablet Take 0.25 mg by mouth as needed.   . Cholecalciferol (VITAMIN D3) 125 MCG (5000 UT) CAPS Take by mouth.  . fexofenadine (ALLEGRA) 180 MG tablet Take 180 mg by mouth as needed.   Marland Kitchen ibuprofen (ADVIL) 200 MG tablet Take 200 mg by  mouth as needed.   . Omega-3 1400 MG CAPS Take by mouth.    Allergies: Patient is allergic to sulfa antibiotics. Family history:  Patient family history includes Arthritis in her father, maternal grandmother, and paternal grandmother; Breast cancer in her maternal aunt; Colon polyps in her father and paternal grandmother; Depression in her mother; Healthy in her daughter; Heart disease in her maternal grandmother; Hyperlipidemia in her father; Hypertension in her maternal grandmother; Melanoma in her maternal aunt and maternal grandmother; Mood Disorder in her sister and son; Rheum  arthritis in her paternal grandfather. Social History   Socioeconomic History  . Marital status: Married    Spouse name: Not on file  . Number of children: 2  . Years of education: Not on file  . Highest education level: Not on file  Occupational History  . Occupation: housewife  Tobacco Use  . Smoking status: Never Smoker  . Smokeless tobacco: Never Used  Vaping Use  . Vaping Use: Never used  Substance and Sexual Activity  . Alcohol use: Yes    Alcohol/week: 0.0 standard drinks    Comment: occ wine  . Drug use: Never  . Sexual activity: Yes    Birth control/protection: Post-menopausal  Other Topics Concern  . 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 Difficulty of Paying Living Expenses:   Food Insecurity:   . Worried About Charity fundraiser in the Last Year:   . Arboriculturist in the Last Year:   Transportation Needs:   . Film/video editor (Medical):   Marland Kitchen Lack of Transportation (Non-Medical):   Physical Activity:   . Days of Exercise per Week:   . Minutes of Exercise per Session:   Stress:   . Feeling of Stress :   Social Connections:   . Frequency of Communication with Friends and Family:   . Frequency of Social Gatherings with Friends and Family:   . Attends Religious Services:   . Active Member of Clubs or Organizations:   . Attends Archivist Meetings:   Marland Kitchen Marital Status:      Review of Systems: Constitutional: Negative for fever malaise or anorexia Cardiovascular: negative for chest pain Respiratory: negative for SOB or persistent cough Gastrointestinal: negative for abdominal pain  Objective  Vitals: BP 124/70   Pulse 90   Temp 98.2 F  (36.8 C) (Temporal)   Resp 18   Ht 5\' 8"  (1.727 m)   Wt 151 lb (68.5 kg)   SpO2 98%   BMI 22.96 kg/m  General: no acute distress, well appearing, no apparent distress, well groomed Psych:  Alert and oriented x 3,anxious and well groomed, good eye contact, rapid speech.    Commons side effects, risks, benefits, and alternatives for medications and treatment plan prescribed today were discussed, and the patient expressed understanding of the given instructions. Patient is instructed to call or message via MyChart if he/she has any questions or concerns regarding our treatment plan. No barriers to understanding were identified. We discussed Red Flag symptoms and signs in detail. Patient expressed understanding regarding what to do  in case of urgent or emergency type symptoms.   Medication list was reconciled, printed and provided to the patient in AVS. Patient instructions and summary information was reviewed with the patient as documented in the AVS. This note was prepared with assistance of Dragon voice recognition software. Occasional wrong-word or sound-a-like substitutions may have occurred due to the inherent limitations of voice recognition software

## 2019-07-17 NOTE — Patient Instructions (Signed)
Please return in 4 weeks to recheck ADD and mood.  If you have any questions or concerns, please don't hesitate to send me a message via MyChart or call the office at [phone]. Thank you for visiting with [place] today! It's our pleasure caring for you.  Please call [place] [facility] to schedule an appointment with Dr. Trey Paula; she is a therapist here at our [place] office.  The phone number is: [phone]  OR:   Psychiatrists  [facility] [place], NP [address] [place], [place] [address] [place], [place] [address] [phone]  Dr. Norma Fredrickson   [facility] [address] [place] [address] [place] [address] [phone] [place], [place] [address] [place] [address] [place] [address] [phone]  Therapists [facility] Pauline Good, NP Magda Paganini NP And they have several counselors there as well.   [facility] [address] [place], [place]  [facility] [address] [place] [address] [place], [place] [phone]  [facility] [address] [place], [place] [address] [place], [place] [address] [phone]  [facility] [address] [place], [place] [address] [place], [place] [address] [place]  [facility] [address] [place], [place] [address] [phone]

## 2019-08-02 IMAGING — MG DIGITAL DIAGNOSTIC UNILATERAL LEFT MAMMOGRAM WITH TOMO AND CAD
5 of 8 series · 5 of 24 positions shown · non-contrast
Comparison: Previous exam(s).
COMPARISON: Previous exam(s).

Addendum:
CLINICAL DATA: Patient describes a palpable lump within the LEFT
breast. Please note that the clinical data in [REDACTED] incorrectly
states RIGHT breast lump, however, patient confirms today that the
palpable lump is within the upper inner quadrant of the LEFT breast.

EXAM:
DIGITAL DIAGNOSTIC LEFT MAMMOGRAM WITH CAD AND TOMO
ULTRASOUND LEFT BREAST

[L CC synth-2D (1 of 2)]
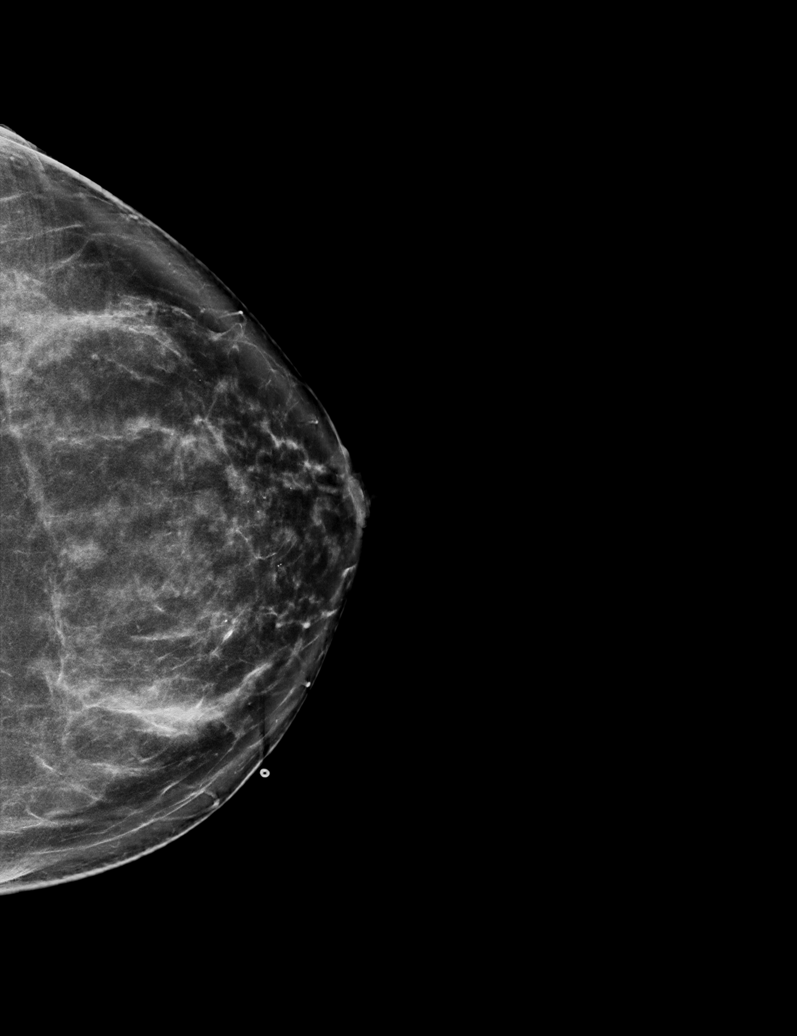

[L CC synth-2D (2 of 2)]
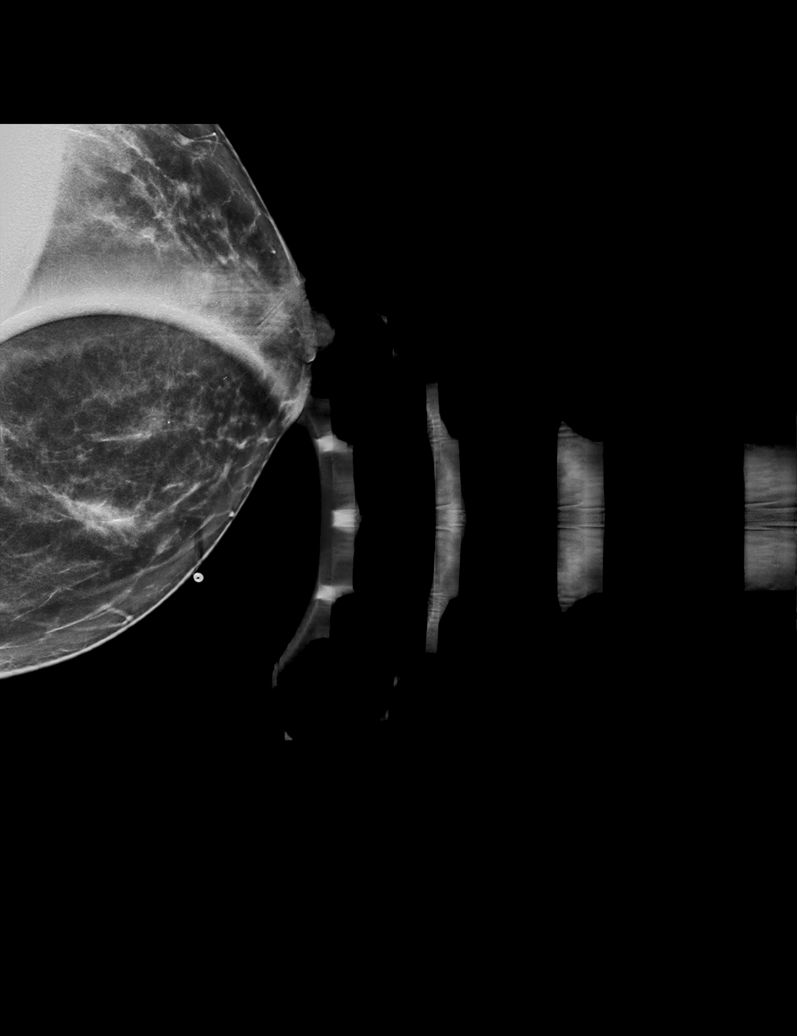

[L ML synth-2D]
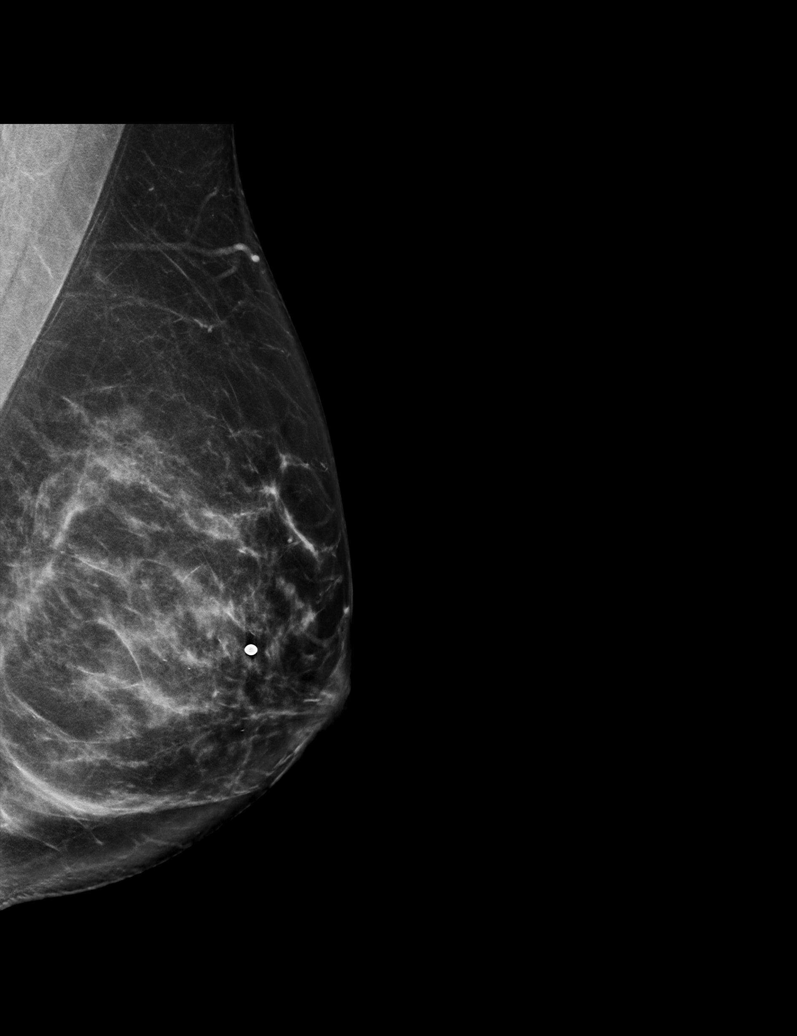

[L MLO synth-2D]
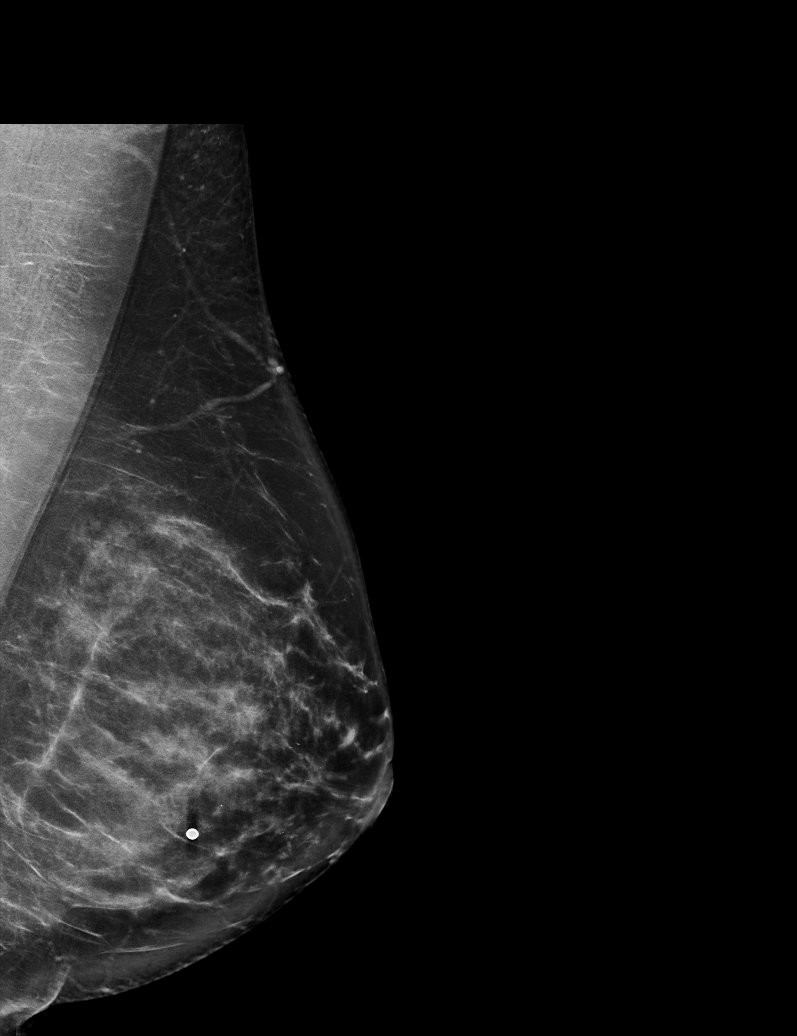

[L MLO tomo · tomo slice 43/85.0]
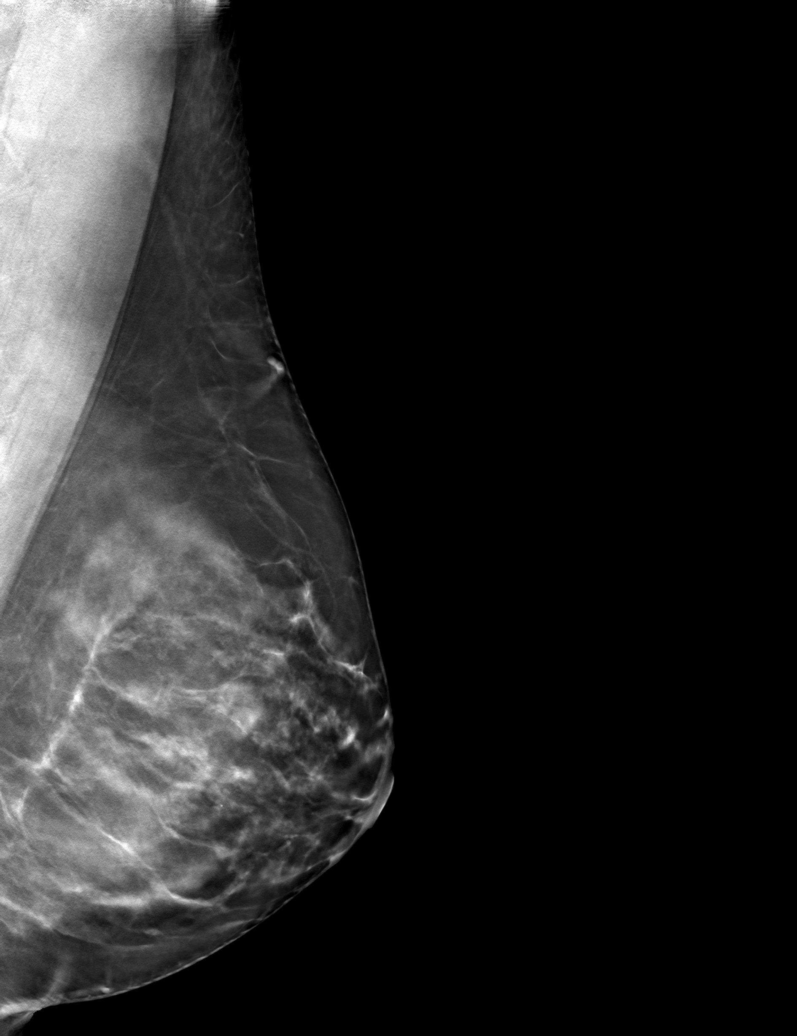

[5 of 24 positions shown; findings below may reference images not displayed]

ACR Breast Density Category c: The breast tissue is heterogeneously
dense, which may obscure small masses.
FINDINGS: LEFT breast CC and MLO views with 3D tomosynthesis were obtained
today, with spot compression view of the inner LEFT breast
corresponding to the area of clinical concern, with overlying skin
marker in place.

Overall fibroglandular pattern of the LEFT breast is stable. There
are no new dominant masses, suspicious calcifications or secondary
signs of malignancy within the LEFT breast. Specifically, there is
no abnormality identified within the inner LEFT breast corresponding
to the area of clinical concern.

Mammographic images were processed with CAD.

On physical exam, there is vague thickening within the upper inner
quadrant of the LEFT breast without evidence of circumscribed mass.

Targeted ultrasound is performed, showing a probably benign cluster
of microcysts in the LEFT breast at the 10 o'clock axis, 3 cm from
the nipple, measuring 5 x 4 x 4 mm, favored to be an incidental
finding. There is a ridge of normal dense fibroglandular tissue
within the upper inner quadrant of the LEFT breast, corresponding to
the area of clinical concern.
IMPRESSION: Probably benign cluster of microcysts within the LEFT breast at the
10 o'clock axis, 3 cm from the nipple, measuring 5 x 4 x 4 mm, with
adjacent ridge of normal dense fibroglandular tissue, corresponding
to the area of clinical concern. Recommend follow-up LEFT breast
diagnostic mammogram and ultrasound in 6 months to ensure stability.

RECOMMENDATION:
1. LEFT breast diagnostic mammogram and ultrasound in 6 months.
2. The patient was instructed to return sooner if the area that she
feels becomes larger and/or firmer to palpation, or if a new
palpable abnormality is identified in either breast.

I have discussed the findings and recommendations with the patient.
Results were also provided in writing at the conclusion of the
visit. If applicable, a reminder letter will be sent to the patient
regarding the next appointment.

BI-RADS CATEGORY  3: Probably benign.

ADDENDUM:
Patient will be due for a routine RIGHT breast screening mammogram
after 02/21/2019. As such, the six-month follow-up LEFT breast
diagnostic mammogram should be performed as a bilateral diagnostic
mammogram after 02/21/2019.

*** End of Addendum ***
ACR Breast Density Category c: The breast tissue is heterogeneously
dense, which may obscure small masses.
FINDINGS: LEFT breast CC and MLO views with 3D tomosynthesis were obtained
today, with spot compression view of the inner LEFT breast
corresponding to the area of clinical concern, with overlying skin
marker in place.

Overall fibroglandular pattern of the LEFT breast is stable. There
are no new dominant masses, suspicious calcifications or secondary
signs of malignancy within the LEFT breast. Specifically, there is
no abnormality identified within the inner LEFT breast corresponding
to the area of clinical concern.

Mammographic images were processed with CAD.

On physical exam, there is vague thickening within the upper inner
quadrant of the LEFT breast without evidence of circumscribed mass.

Targeted ultrasound is performed, showing a probably benign cluster
of microcysts in the LEFT breast at the 10 o'clock axis, 3 cm from
the nipple, measuring 5 x 4 x 4 mm, favored to be an incidental
finding. There is a ridge of normal dense fibroglandular tissue
within the upper inner quadrant of the LEFT breast, corresponding to
the area of clinical concern.
IMPRESSION: Probably benign cluster of microcysts within the LEFT breast at the
10 o'clock axis, 3 cm from the nipple, measuring 5 x 4 x 4 mm, with
adjacent ridge of normal dense fibroglandular tissue, corresponding
to the area of clinical concern. Recommend follow-up LEFT breast
diagnostic mammogram and ultrasound in 6 months to ensure stability.

RECOMMENDATION:
1. LEFT breast diagnostic mammogram and ultrasound in 6 months.
2. The patient was instructed to return sooner if the area that she
feels becomes larger and/or firmer to palpation, or if a new
palpable abnormality is identified in either breast.

I have discussed the findings and recommendations with the patient.
Results were also provided in writing at the conclusion of the
visit. If applicable, a reminder letter will be sent to the patient
regarding the next appointment.

BI-RADS CATEGORY  3: Probably benign.

## 2019-08-05 DIAGNOSIS — H1045 Other chronic allergic conjunctivitis: Secondary | ICD-10-CM | POA: Diagnosis not present

## 2019-08-05 DIAGNOSIS — H40013 Open angle with borderline findings, low risk, bilateral: Secondary | ICD-10-CM | POA: Diagnosis not present

## 2019-08-06 DIAGNOSIS — N3001 Acute cystitis with hematuria: Secondary | ICD-10-CM | POA: Diagnosis not present

## 2019-08-06 DIAGNOSIS — R300 Dysuria: Secondary | ICD-10-CM | POA: Diagnosis not present

## 2019-08-13 ENCOUNTER — Telehealth: Payer: BC Managed Care – PPO | Admitting: Family Medicine

## 2019-08-15 ENCOUNTER — Ambulatory Visit: Payer: BC Managed Care – PPO | Admitting: Family Medicine

## 2019-08-15 ENCOUNTER — Other Ambulatory Visit: Payer: Self-pay

## 2019-08-15 ENCOUNTER — Encounter: Payer: Self-pay | Admitting: Family Medicine

## 2019-08-15 VITALS — BP 122/70 | Ht 68.0 in | Wt 149.2 lb

## 2019-08-15 DIAGNOSIS — F39 Unspecified mood [affective] disorder: Secondary | ICD-10-CM

## 2019-08-15 DIAGNOSIS — F411 Generalized anxiety disorder: Secondary | ICD-10-CM

## 2019-08-15 DIAGNOSIS — F988 Other specified behavioral and emotional disorders with onset usually occurring in childhood and adolescence: Secondary | ICD-10-CM

## 2019-08-15 DIAGNOSIS — R350 Frequency of micturition: Secondary | ICD-10-CM | POA: Diagnosis not present

## 2019-08-15 DIAGNOSIS — Z8659 Personal history of other mental and behavioral disorders: Secondary | ICD-10-CM

## 2019-08-15 LAB — POCT URINALYSIS DIPSTICK
Bilirubin, UA: NEGATIVE
Blood, UA: NEGATIVE
Glucose, UA: NEGATIVE
Ketones, UA: NEGATIVE
Leukocytes, UA: NEGATIVE
Nitrite, UA: NEGATIVE
Protein, UA: NEGATIVE
Spec Grav, UA: 1.01 (ref 1.010–1.025)
Urobilinogen, UA: 0.2 E.U./dL
pH, UA: 6 (ref 5.0–8.0)

## 2019-08-15 MED ORDER — AMPHETAMINE-DEXTROAMPHET ER 10 MG PO CP24: 10.0000 mg | ORAL_CAPSULE | Freq: Every day | ORAL | 0 refills | Status: DC

## 2019-08-15 NOTE — Patient Instructions (Signed)
Please return in 3 months for ADD recheck and mood  Your urine is normal today!  If you have any questions or concerns, please don't hesitate to send me a message via MyChart or call the office at [phone]. Thank you for visiting with [place] today! It's our pleasure caring for you.

## 2019-08-15 NOTE — Progress Notes (Signed)
Subjective  CC:  Chief Complaint  Patient presents with  . Mood and ADD F/U    Mood has improved since her last office visit.   Marland Kitchen Urinary Frequency    She had some urinary frequency that has slowed down.  Marland Kitchen Health Maintenance    Mammogram is being done on 08-20-2019    HPI: Audrey Cox is a 54 y.o. female who presents to the office today to address the problems listed above in the chief complaint.  Last month we started low-dose Adderall XR 10 mg daily due to significant ADD symptoms.  She also struggling with anxiety and chronic worry.  Refer to last note.  She feels that she is doing significantly better.  "Night and day difference".  She is able to focus, listen better, and therefore stay on track more.  She is functioning better at work.  She able to control worry better.  She is having no adverse effects from medications.  They are lasting most of the day, noticing a change between 3 and 5 PM.  She is doing okay at night.  No sleep interference.  Appetite is stable.  She is scheduled with a psychotherapist and will start next month.  She is looking forward to this.  Had minute clinic visit a few weeks ago for presumed UTI.  Urine culture was negative.  She describes suprapubic pressure and urinary frequency without incontinence, dysuria or vaginal discharge.  No symptoms have resolved except for a mild pelvic pressure.  She sees GYN next week and will discuss further with him.  Anxiety and low mood, both improved.  Still with some worry but not to the degree it was before.  Continues to have some stressors with her son and work-related but managing much better.  No panic symptoms.  No vegetative symptoms. Assessment  1. Attention deficit disorder (ADD) without hyperactivity   2. Mood disorder ([place])   3. Urinary frequency   4. Anxiety state   5. History of depression      Plan   ADD: Patient agrees with diagnosis.  Improved patient.  Education done.  Continue Adderall XR 10  mg daily.  Prescriptions for the next 3 months prescribed.  Education on controlled stimulants and compliance given.  Anxiety with history of depression: Agree with starting psychotherapy.  CBT should be helpful.  She may carry diagnosis GAD.  Monitor  Recent urinary frequency: Irritative symptoms but without infection.  Education given.  Normal UA today in office.  May be GYN related.  Has follow-up next week.  Recommend drinking plenty of water to keep hydrated  Health maintenance: Has mammogram scheduled for next week as well. Follow up: 3 months for ADD and mood recheck Visit date not found  Orders Placed This Encounter  Procedures  . POCT Urinalysis Dipstick   Meds ordered this encounter  Medications  . amphetamine-dextroamphetamine (ADDERALL XR) 10 MG 24 hr capsule    Sig: Take 1 capsule (10 mg total) by mouth daily.    Dispense:  30 capsule    Refill:  0  . amphetamine-dextroamphetamine (ADDERALL XR) 10 MG 24 hr capsule    Sig: Take 1 capsule (10 mg total) by mouth daily.    Dispense:  30 capsule    Refill:  0    Please keep on file for next refill due August  . amphetamine-dextroamphetamine (ADDERALL XR) 10 MG 24 hr capsule    Sig: Take 1 capsule (10 mg total) by mouth daily.  Dispense:  30 capsule    Refill:  0    Please keep on file for next refill due september      I reviewed the patients updated PMH, FH, and SocHx.    Patient Active Problem List   Diagnosis Date Noted  . Attention deficit disorder (ADD) without hyperactivity 08/15/2019  . Perimenopausal symptoms 04/30/2019  . Attention and concentration deficit 04/30/2019  . History of depression 04/30/2019  . Family history of malignant melanoma 04/30/2019  . Abnormal cervical Papanicolaou smear 02/18/2018  . Irritable bowel syndrome 02/18/2018  . Anxiety state 11/17/2014  . Rhinitis, allergic 11/17/2014   Current Meds  Medication Sig  . ALPRAZolam (XANAX) 0.25 MG tablet Take 0.25 mg by mouth as  needed.   Marland Kitchen amphetamine-dextroamphetamine (ADDERALL XR) 10 MG 24 hr capsule Take 1 capsule (10 mg total) by mouth daily.  Derrill Memo ON 09/14/2019] amphetamine-dextroamphetamine (ADDERALL XR) 10 MG 24 hr capsule Take 1 capsule (10 mg total) by mouth daily.  Derrill Memo ON 10/14/2019] amphetamine-dextroamphetamine (ADDERALL XR) 10 MG 24 hr capsule Take 1 capsule (10 mg total) by mouth daily.  . Cholecalciferol (VITAMIN D3) 125 MCG (5000 UT) CAPS Take by mouth.  . fexofenadine (ALLEGRA) 180 MG tablet Take 180 mg by mouth as needed.   Marland Kitchen ibuprofen (ADVIL) 200 MG tablet Take 200 mg by mouth as needed.   . Omega-3 1400 MG CAPS Take by mouth.  . [DISCONTINUED] amphetamine-dextroamphetamine (ADDERALL XR) 10 MG 24 hr capsule Take 1 capsule (10 mg total) by mouth daily.    Allergies: Patient is allergic to sulfa antibiotics. Family History: Patient family history includes Arthritis in her father, maternal grandmother, and paternal grandmother; Breast cancer in her maternal aunt; Colon polyps in her father and paternal grandmother; Depression in her mother; Healthy in her daughter; Heart disease in her maternal grandmother; Hyperlipidemia in her father; Hypertension in her maternal grandmother; Melanoma in her maternal aunt and maternal grandmother; Mood Disorder in her sister and son; Rheum arthritis in her paternal grandfather. Social History:  Patient  reports that she has never smoked. She has never used smokeless tobacco. She reports current alcohol use. She reports that she does not use drugs.  Review of Systems: Constitutional: Negative for fever malaise or anorexia Cardiovascular: negative for chest pain Respiratory: negative for SOB or persistent cough Gastrointestinal: negative for abdominal pain  Objective  Vitals: BP 122/70   Ht 5\' 8"  (1.727 m)   Wt 149 lb 3.2 oz (67.7 kg)   BMI 22.69 kg/m  General: no acute distress , A&Ox3 Psych: Appears calm, happier today.  Good eye contact, normal  speech HEENT: PEERL, conjunctiva normal, neck is supple Gastrointestinal: soft, flat abdomen, normal active bowel sounds, no palpable masses, no suprapubic tenderness no hepatosplenomegaly, no appreciated hernias  Office Visit on 08/15/2019  Component Date Value Ref Range Status  . Color, UA 08/15/2019 yellow   Final  . Clarity, UA 08/15/2019 clear   Final  . Glucose, UA 08/15/2019 Negative  Negative Final  . Bilirubin, UA 08/15/2019 negative   Final  . Ketones, UA 08/15/2019 negative   Final  . Spec Grav, UA 08/15/2019 1.010  1.010 - 1.025 Final  . Blood, UA 08/15/2019 negative   Final  . pH, UA 08/15/2019 6.0  5.0 - 8.0 Final  . Protein, UA 08/15/2019 Negative  Negative Final  . Urobilinogen, UA 08/15/2019 0.2  0.2 or 1.0 E.U./dL Final  . Nitrite, UA 08/15/2019 negative   Final  . Leukocytes, UA  08/15/2019 Negative  Negative Final     Commons side effects, risks, benefits, and alternatives for medications and treatment plan prescribed today were discussed, and the patient expressed understanding of the given instructions. Patient is instructed to call or message via MyChart if he/she has any questions or concerns regarding our treatment plan. No barriers to understanding were identified. We discussed Red Flag symptoms and signs in detail. Patient expressed understanding regarding what to do in case of urgent or emergency type symptoms.   Medication list was reconciled, printed and provided to the patient in AVS. Patient instructions and summary information was reviewed with the patient as documented in the AVS. This note was prepared with assistance of Dragon voice recognition software. Occasional wrong-word or sound-a-like substitutions may have occurred due to the inherent limitations of voice recognition software  This visit occurred during the SARS-CoV-2 public health emergency.  Safety protocols were in place, including screening questions prior to the visit, additional usage of  staff PPE, and extensive cleaning of exam room while observing appropriate contact time as indicated for disinfecting solutions.

## 2019-08-20 DIAGNOSIS — Z1231 Encounter for screening mammogram for malignant neoplasm of breast: Secondary | ICD-10-CM | POA: Diagnosis not present

## 2019-08-20 DIAGNOSIS — Z6822 Body mass index (BMI) 22.0-22.9, adult: Secondary | ICD-10-CM | POA: Diagnosis not present

## 2019-08-20 DIAGNOSIS — Z01419 Encounter for gynecological examination (general) (routine) without abnormal findings: Secondary | ICD-10-CM | POA: Diagnosis not present

## 2019-08-20 LAB — HM PAP SMEAR: HM Pap smear: NOT DETECTED

## 2019-08-20 LAB — CBC AND DIFFERENTIAL: Hemoglobin: 16 (ref 12.0–16.0)

## 2019-08-20 LAB — HM MAMMOGRAPHY

## 2019-09-01 ENCOUNTER — Encounter: Payer: Self-pay | Admitting: Family Medicine

## 2019-09-09 ENCOUNTER — Telehealth: Payer: Self-pay | Admitting: Family Medicine

## 2019-09-09 NOTE — Telephone Encounter (Signed)
Prescriptions for July, august and September were sent to pharmacy at her recent visit. Should call pharmacy for refill. They should have the RX on file. See my last note.  thanks

## 2019-09-09 NOTE — Telephone Encounter (Signed)
MEDICATION: amphetamine-dextroamphetamine (ADDERALL XR) 10 MG 24 hr capsule  PHARMACY: [id number] [place], [place] - [address] [place] [address] [place] [address] Phone:  [phone]  Fax:  [phone]       Comments: Patient is down to 6 and is going out of town  **Let patient know to contact pharmacy at the end of the day to make sure medication is ready. **  ** Please notify patient to allow 48-72 hours to process**  **Encourage patient to contact the pharmacy for refills or they can request refills through [facility]**

## 2019-09-10 NOTE — Telephone Encounter (Signed)
Pharmacy calling to see if it can be refilled early since she is going out of town.

## 2019-09-10 NOTE — Telephone Encounter (Signed)
[facility] states [facility] is telling [facility] cannot refill the medication. It may be because it is too early. Pt is going to call [facility] to verify. Pt stated her current medication bottle says she has 0 refills.

## 2019-09-10 NOTE — Telephone Encounter (Signed)
[place],  pharmacy should have new rxs on file. Please call them and verify.

## 2019-09-10 NOTE — Telephone Encounter (Signed)
[facility] [place] and gave the verbal ok(per Dr. Jonni Sanger) to fill her Adderall prescription due to the patient going out of town.

## 2019-09-22 ENCOUNTER — Ambulatory Visit (INDEPENDENT_AMBULATORY_CARE_PROVIDER_SITE_OTHER): Payer: BC Managed Care – PPO | Admitting: Psychology

## 2019-09-22 DIAGNOSIS — F4323 Adjustment disorder with mixed anxiety and depressed mood: Secondary | ICD-10-CM

## 2019-10-06 ENCOUNTER — Ambulatory Visit (INDEPENDENT_AMBULATORY_CARE_PROVIDER_SITE_OTHER): Payer: BC Managed Care – PPO | Admitting: Psychology

## 2019-10-06 DIAGNOSIS — F4323 Adjustment disorder with mixed anxiety and depressed mood: Secondary | ICD-10-CM

## 2019-10-15 ENCOUNTER — Telehealth: Payer: Self-pay | Admitting: Family Medicine

## 2019-10-15 NOTE — Telephone Encounter (Signed)
LR: 09-14-2019 Qty: 30 with 0 refills  Last office visit: 08-15-2019 Upcoming appointment: 05-05-2020

## 2019-10-15 NOTE — Telephone Encounter (Signed)
Spoke with pharmacy and the patient has already picked up prescription.

## 2019-10-15 NOTE — Telephone Encounter (Signed)
Pt states she called [facility] and they do not have the prescription.

## 2019-10-15 NOTE — Telephone Encounter (Signed)
Medication was filled yesterday

## 2019-10-15 NOTE — Telephone Encounter (Signed)
Please call pharmacy; Rxs were sent to pharmacy at last visit. Should have a RX for start sept 7th on file.

## 2019-10-15 NOTE — Telephone Encounter (Signed)
MEDICATION: Adderall XR  PHARMACY: [facility] [address] [place], [place]  Comments:   **Let patient know to contact pharmacy at the end of the day to make sure medication is ready. **  ** Please notify patient to allow 48-72 hours to process**  **Encourage patient to contact the pharmacy for refills or they can request refills through [facility]**

## 2019-10-20 ENCOUNTER — Ambulatory Visit: Payer: BC Managed Care – PPO | Admitting: Psychology

## 2019-11-14 ENCOUNTER — Other Ambulatory Visit: Payer: Self-pay | Admitting: Family Medicine

## 2019-11-14 NOTE — Telephone Encounter (Signed)
Patient is leaving out of town and is requesting we send her refill to [place]  [id number] she will be down there until Monday and her last dose is today  Thank you.

## 2019-11-14 NOTE — Telephone Encounter (Signed)
Patient is following up regarding medication and is  Leaving to go out of town within the next few hours and would like to pick it up before she leaves

## 2019-11-16 NOTE — Telephone Encounter (Signed)
Last refill: 10/14/19 #30, 0 Last OV: 08/15/19 dx. ADD

## 2019-11-17 MED ORDER — AMPHETAMINE-DEXTROAMPHET ER 10 MG PO CP24: 10.0000 mg | ORAL_CAPSULE | Freq: Every day | ORAL | 0 refills | Status: DC

## 2019-12-03 ENCOUNTER — Ambulatory Visit: Payer: BC Managed Care – PPO | Admitting: Psychology

## 2019-12-10 ENCOUNTER — Encounter: Payer: Self-pay | Admitting: Family Medicine

## 2019-12-11 MED ORDER — AMPHETAMINE-DEXTROAMPHET ER 20 MG PO CP24: 20.0000 mg | ORAL_CAPSULE | Freq: Every day | ORAL | 0 refills | Status: DC

## 2020-01-05 ENCOUNTER — Telehealth: Payer: BC Managed Care – PPO | Admitting: Family Medicine

## 2020-01-05 ENCOUNTER — Telehealth: Payer: Self-pay

## 2020-01-05 NOTE — Telephone Encounter (Signed)
urse Assessment Nurse: Zorita Pang, RN, Deborah Date/Time (Eastern Time): 01/05/2020 1:22:58 PM Confirm and document reason for call. If symptomatic, describe symptoms. ---The caller states that she is having off and on fluttering type feeling in her chest. Has a quick breath. She states that she has anxiety. Back muscles are spasms too. Was put on Adderall XR and switched to the 20 mg. Did not take it since Saturday. No SOB but feels tight. When she relaxes it goes away. She has been taking her heart rate it was 94 yesterday. BP was good. Does the patient have any new or worsening symptoms? ---Yes Will a triage be completed? ---Yes Related visit to physician within the last 2 weeks? ---No Does the PT have any chronic conditions? (i.e. diabetes, asthma, this includes High risk factors for pregnancy, etc.) ---Yes List chronic conditions. ---ADHD, anxiety Is the patient pregnant or possibly pregnant? (Ask all females between the ages of 48-55) ---No Is this a behavioral health or substance abuse call? ---No Guidelines Guideline Title Affirmed Question Affirmed Notes Nurse Date/Time (Eastern Time) Chest Pain [1] Chest pain lasts > 5 minutes AND [2] described as crushing, pressure-like, or heavy Zorita Pang, RN, Neoma Laming 01/05/2020 1:27:43 PM Disp. Time Eilene Ghazi Time) Disposition Final User 01/05/2020 1:20:50 PM Send to Urgent Arrie Aran PLEASE NOTE: All timestamps contained within this report are represented as [place] Standard Time. CONFIDENTIALTY NOTICE: This fax transmission is intended only for the addressee. It contains information that is legally privileged, confidential or otherwise protected from use or disclosure. If you are not the intended recipient, you are strictly prohibited from reviewing, disclosing, copying using or disseminating any of this information or taking any action in reliance on or regarding this information. If you have received this fax in error, please notify  us immediately by telephone so that we can arrange for its return to [place]. Phone: [phone], Toll-Free: [phone], Fax: [phone] Page: 2 of 2 Call Id: [id number] [place]. Time Eilene Ghazi Time) Disposition Final User 01/05/2020 1:34:37 PM 911 Outcome Documentation Womble, RN, Neoma Laming Reason: Caller declines calling 911 and going to the ED. States that she wants to be seen by her MD. This nurse explained that with her symptoms her heart needs to be evaluated quickly and still refusing to go to ED or UC. 01/05/2020 1:32:13 PM Call EMS 911 Now Yes Womble, RN, Garrel Ridgel Disagree/Comply Disagree Caller Understands Yes PreDisposition 911 Care Advice Given Per Guideline CALL EMS 911 NOW

## 2020-01-05 NOTE — Telephone Encounter (Signed)
FYI

## 2020-01-06 NOTE — Telephone Encounter (Signed)
Patient has an appointment on Tuesday.

## 2020-01-06 NOTE — Telephone Encounter (Signed)
Please get patient scheduled for anxiety

## 2020-01-06 NOTE — Telephone Encounter (Signed)
Sounds like anxiety. Get her scheduled with me please. thanks

## 2020-01-13 ENCOUNTER — Encounter: Payer: Self-pay | Admitting: Family Medicine

## 2020-01-13 ENCOUNTER — Other Ambulatory Visit: Payer: Self-pay

## 2020-01-13 ENCOUNTER — Ambulatory Visit: Payer: BC Managed Care – PPO | Admitting: Family Medicine

## 2020-01-13 VITALS — BP 120/82 | HR 95 | Temp 98.0°F | Ht 68.0 in | Wt 144.2 lb

## 2020-01-13 DIAGNOSIS — Z23 Encounter for immunization: Secondary | ICD-10-CM | POA: Diagnosis not present

## 2020-01-13 DIAGNOSIS — F988 Other specified behavioral and emotional disorders with onset usually occurring in childhood and adolescence: Secondary | ICD-10-CM

## 2020-01-13 DIAGNOSIS — F411 Generalized anxiety disorder: Secondary | ICD-10-CM | POA: Diagnosis not present

## 2020-01-13 DIAGNOSIS — R002 Palpitations: Secondary | ICD-10-CM | POA: Diagnosis not present

## 2020-01-13 DIAGNOSIS — N951 Menopausal and female climacteric states: Secondary | ICD-10-CM

## 2020-01-13 LAB — CBC WITH DIFFERENTIAL/PLATELET
Absolute Monocytes: 602 cells/uL (ref 200–950)
Basophils Absolute: 85 cells/uL (ref 0–200)
Basophils Relative: 0.9 %
Eosinophils Absolute: 113 cells/uL (ref 15–500)
Eosinophils Relative: 1.2 %
HCT: 44.3 % (ref 35.0–45.0)
Hemoglobin: 15.2 g/dL (ref 11.7–15.5)
Lymphs Abs: 2040 cells/uL (ref 850–3900)
MCH: 30.6 pg (ref 27.0–33.0)
MCHC: 34.3 g/dL (ref 32.0–36.0)
MCV: 89.3 fL (ref 80.0–100.0)
MPV: 9.6 fL (ref 7.5–12.5)
Monocytes Relative: 6.4 %
Neutro Abs: 6561 cells/uL (ref 1500–7800)
Neutrophils Relative %: 69.8 %
Platelets: 379 10*3/uL (ref 140–400)
RBC: 4.96 10*6/uL (ref 3.80–5.10)
RDW: 12.6 % (ref 11.0–15.0)
Total Lymphocyte: 21.7 %
WBC: 9.4 10*3/uL (ref 3.8–10.8)

## 2020-01-13 MED ORDER — AMPHETAMINE-DEXTROAMPHETAMINE 10 MG PO TABS: 10.0000 mg | ORAL_TABLET | Freq: Every day | ORAL | 0 refills | Status: DC

## 2020-01-13 NOTE — Progress Notes (Signed)
Subjective  CC:  Chief Complaint  Patient presents with  . Palpitations    started experiencing palpitations after starting increased dose of Adderall, stopped medication 11 days ago  . Health Maintenance    flu shot given in office today    HPI: Audrey Cox is a 54 y.o. female who presents to the office today to address the problems listed above in the chief complaint.   54 year old premenopausal female presents due to fluttering in her chest with associated with chest tightness intermittently over the last several months.  She admits that started over the summer but very rarely.  Perhaps started after he started Adderall back in the summer.  Definitely on the last 2 to 3 weeks she has noticed increasing fluttering and increased heart rate sometimes up to 110.  She thought this may be related to increasing Adderall from 10 mg daily to 20 mg daily.  However symptoms have persisted off Adderall over the last week.  She feels at times she cannot take a deep breath.  She denies numbness or tingling in her face or extremities.  She has no history of panic attacks but does have chronic anxiety.  She reports her stress has been increased with family stressors and also more recently due to the death in the family due to [place].  Her GYN gave her Xanax and she uses it very rarely.  She is not sure if this has helped her symptoms but does calm her anxiety down.  She is seeing a therapist and feels that that is starting to be helpful.  She tends to be hesitant to take SSRIs for chronic anxiety although she is tried Zoloft in the past.  She does feel Adderall helps her ADD significantly.  She has had no fevers, chills, exertional chest pain or dyspnea on exertion.  She has no pleuritic chest pain.  No lower extremity edema.  No premature family history of cardiovascular disease.  She is not hyperlipidemic, diabetic or hypertensive.  She is a non-smoker.  Menstrual cycles are more intermittent.  LMP was in  October.  She denies hot flashes.  No tremor.  No sweats.  Assessment  1. Palpitations   2. Attention deficit disorder (ADD) without hyperactivity   3. Anxiety state   4. Perimenopausal symptoms      Plan   Palpitations: Multifactorial but most likely related to anxiety state and medications.  Avoid caffeine.  Decrease Adderall dose back to 10 mg daily.  Her monitor for 48 hours more for reassurance to rule out other arrhythmia.  Reviewing chart, baseline heart rate at rest is typically 80s to 90s.  Reviewed EKGs.  No significant changes over the last 3 years.  No ischemic changes.  No arrhythmias noted.  Check CBC to be thorough.  She brings in normal TFTs.  Recommend trial of Xanax to see if this helps her symptomatology.  Stress reduction continue counseling.  Consider treating for chronic anxiety as well.  Patient is hesitant at this time.  Restart ADD treatment.  Recheck in 4 to 6 weeks.  Follow up: Return in about 4 weeks (around 02/10/2020) for recheck.  Orders Placed This Encounter  Procedures  . CBC with Differential/Platelet  . Iron, TIBC and Ferritin Panel  . Holter monitor - 48 hour  . EKG 12-Lead   Meds ordered this encounter  Medications  . amphetamine-dextroamphetamine (ADDERALL) 10 MG tablet    Sig: Take 1 tablet (10 mg total) by mouth daily.  Dispense:  30 tablet    Refill:  0      I reviewed the patients updated PMH, FH, and SocHx.    Patient Active Problem List   Diagnosis Date Noted  . Attention deficit disorder (ADD) without hyperactivity 08/15/2019  . Perimenopausal symptoms 04/30/2019  . Attention and concentration deficit 04/30/2019  . History of depression 04/30/2019  . Family history of malignant melanoma 04/30/2019  . Abnormal cervical Papanicolaou smear 02/18/2018  . Irritable bowel syndrome 02/18/2018  . Anxiety state 11/17/2014  . Rhinitis, allergic 11/17/2014   Current Meds  Medication Sig  . ALPRAZolam (XANAX) 0.25 MG tablet Take 0.25 mg  by mouth as needed.   . Cholecalciferol (VITAMIN D3) 125 MCG (5000 UT) CAPS Take by mouth.  . Ferrous Sulfate (IRON PO) Take by mouth.  . fexofenadine (ALLEGRA) 180 MG tablet Take 180 mg by mouth as needed.   Marland Kitchen ibuprofen (ADVIL) 200 MG tablet Take 200 mg by mouth as needed.   . Omega-3 1400 MG CAPS Take by mouth.    Allergies: Patient is allergic to sulfa antibiotics. Family History: Patient family history includes Arthritis in her father, maternal grandmother, and paternal grandmother; Breast cancer in her maternal aunt; Colon polyps in her father and paternal grandmother; Depression in her mother; Healthy in her daughter; Heart disease in her maternal grandfather, maternal grandmother, and mother; Hyperlipidemia in her father; Hypertension in her maternal grandmother; Melanoma in her maternal aunt and maternal grandmother; Mood Disorder in her sister and son; Rheum arthritis in her paternal grandfather. Social History:  Patient  reports that she has never smoked. She has never used smokeless tobacco. She reports current alcohol use. She reports that she does not use drugs.  Review of Systems: Constitutional: Negative for fever malaise or anorexia Cardiovascular: negative for chest pain Respiratory: negative for SOB or persistent cough Gastrointestinal: negative for abdominal pain  Objective  Vitals: BP 120/82   Pulse 95   Temp 98 F (36.7 C) (Temporal)   Ht 5\' 8"  (1.727 m)   Wt 144 lb 3.2 oz (65.4 kg)   SpO2 98%   BMI 21.93 kg/m  General: no acute distress , A&Ox3 Psych: Anxious affect, normal cognition HEENT: PEERL, conjunctiva normal, Oropharynx moist,neck is supple Cardiovascular:  RRR without murmur or gallop.  No chest wall tenderness.  No lower extremity edema Respiratory:  Good breath sounds bilaterally, CTAB with normal respiratory effort Skin:  Warm, no rashes Neuro: Mildly tremulous  EKG: Normal sinus rhythm, nonspecific T wave changes, aRVR' in V1.  No significant  change from EKG compared in 2018.  No ischemic changes.    Commons side effects, risks, benefits, and alternatives for medications and treatment plan prescribed today were discussed, and the patient expressed understanding of the given instructions. Patient is instructed to call or message via MyChart if he/she has any questions or concerns regarding our treatment plan. No barriers to understanding were identified. We discussed Red Flag symptoms and signs in detail. Patient expressed understanding regarding what to do in case of urgent or emergency type symptoms.   Medication list was reconciled, printed and provided to the patient in AVS. Patient instructions and summary information was reviewed with the patient as documented in the AVS. This note was prepared with assistance of Dragon voice recognition software. Occasional wrong-word or sound-a-like substitutions may have occurred due to the inherent limitations of voice recognition software

## 2020-01-13 NOTE — Patient Instructions (Addendum)
Please follow up ain 4-6 weeks for recheck palpitations.   We will call you to get you set up for a heart monitor.  Try the xanax to see if it calms you and your symptoms down.   I've ordered the 10mg  of generic adderall for you to restart.   If you have any questions or concerns, please don't hesitate to send me a message via MyChart or call the office at [phone]. Thank you for visiting with [place] today! It's our pleasure caring for you.   Palpitations Palpitations are feelings that your heartbeat is irregular or is faster than normal. It may feel like your heart is fluttering or skipping a beat. Palpitations are usually not a serious problem. They may be caused by many things, including smoking, caffeine, alcohol, stress, and certain medicines or drugs. Most causes of palpitations are not serious. However, some palpitations can be a sign of a serious problem. You may need further tests to rule out serious medical problems. Follow these instructions at home:     Pay attention to any changes in your condition. Take these actions to help manage your symptoms: Eating and drinking  Avoid foods and drinks that may cause palpitations. These may include: ? Caffeinated coffee, tea, soft drinks, diet pills, and energy drinks. ? Chocolate. ? Alcohol. Lifestyle  Take steps to reduce your stress and anxiety. Things that can help you relax include: ? Yoga. ? Mind-body activities, such as deep breathing, meditation, or using words and images to create positive thoughts (guided imagery). ? Physical activity, such as swimming, jogging, or walking. Tell your health care provider if your palpitations increase with activity. If you have chest pain or shortness of breath with activity, do not continue the activity until you are seen by your health care provider. ? Biofeedback. This is a method that helps you learn to use your mind to control things in your body, such as your heartbeat.  Do not use  drugs, including cocaine or ecstasy. Do not use marijuana.  Get plenty of rest and sleep. Keep a regular bed time. General instructions  Take over-the-counter and prescription medicines only as told by your health care provider.  Do not use any products that contain nicotine or tobacco, such as cigarettes and e-cigarettes. If you need help quitting, ask your health care provider.  Keep all follow-up visits as told by your health care provider. This is important. These may include visits for further testing if palpitations do not go away or get worse. Contact a health care provider if you:  Continue to have a fast or irregular heartbeat after 24 hours.  Notice that your palpitations occur more often. Get help right away if you:  Have chest pain or shortness of breath.  Have a severe headache.  Feel dizzy or you faint. Summary  Palpitations are feelings that your heartbeat is irregular or is faster than normal. It may feel like your heart is fluttering or skipping a beat.  Palpitations may be caused by many things, including smoking, caffeine, alcohol, stress, certain medicines, and drugs.  Although most causes of palpitations are not serious, some causes can be a sign of a serious medical problem.  Get help right away if you faint or have chest pain, shortness of breath, a severe headache, or dizziness. This information is not intended to replace advice given to you by your health care provider. Make sure you discuss any questions you have with your health care provider. Document Revised: 03/07/2017 Document  Reviewed: 03/07/2017 Elsevier Patient Education  [employer].

## 2020-01-14 LAB — IRON,TIBC AND FERRITIN PANEL
%SAT: 25 % (calc) (ref 16–45)
Ferritin: 20 ng/mL (ref 16–232)
Iron: 102 ug/dL (ref 45–160)
TIBC: 405 mcg/dL (calc) (ref 250–450)

## 2020-01-25 ENCOUNTER — Encounter: Payer: Self-pay | Admitting: Family Medicine

## 2020-01-26 ENCOUNTER — Telehealth: Payer: Self-pay

## 2020-01-26 NOTE — Telephone Encounter (Signed)
Discussed patient's concerns with quarantining. Aware 10 day quarantine from onset of symptoms and 24 hours symptom free

## 2020-01-26 NOTE — Telephone Encounter (Signed)
Pt was tested positive for Covid. She would like a CMA to give her a call to discuss how long she needs to quarantine for.

## 2020-01-27 ENCOUNTER — Telehealth: Payer: Self-pay

## 2020-01-27 NOTE — Telephone Encounter (Signed)
Nurse Assessment Nurse: Susy Manor, RN, Megan Date/Time (Eastern Time): 01/24/2020 8:20:30 AM Confirm and document reason for call. If symptomatic, describe symptoms. ---Just tested positive for covid with OTC rapid test. She started with cough, congestion Wednesday. Around 2-3 am she had a temp of 102.5, loss of taste and smell, fever. Does the patient have any new or worsening symptoms? ---Yes Will a triage be completed? ---Yes Related visit to physician within the last 2 weeks? ---No Does the PT have any chronic conditions? (i.e. diabetes, asthma, this includes High risk factors for pregnancy, etc.) ---No Is the patient pregnant or possibly pregnant? (Ask all females between the ages of 79-55) ---No Is this a behavioral health or substance abuse call? ---No Guidelines Guideline Title Affirmed Question Affirmed Notes Nurse Date/Time ([place] Time) COVID-19 - Diagnosed or Suspected Chest pain or pressure Susy Manor, RN, Megan 01/24/2020 8:23:27 AM Disp. Time Eilene Ghazi Time) Disposition Final User 01/24/2020 8:28:33 AM Go to ED Now (or PCP triage) Yes Susy Manor, RN, Megan PLEASE NOTE: All timestamps contained within this report are represented as [place] Standard Time. CONFIDENTIALTY NOTICE: This fax transmission is intended only for the addressee. It contains information that is legally privileged, confidential or otherwise protected from use or disclosure. If you are not the intended recipient, you are strictly prohibited from reviewing, disclosing, copying using or disseminating any of this information or taking any action in reliance on or regarding this information. If you have received this fax in error, please notify us immediately by telephone so that we can arrange for its return to [place]. Phone: [phone], Toll-Free: [phone], Fax: [phone] Page: 2 of 2 Call Id: [id number] [place] Disagree/Comply Comply Caller Understands Yes PreDisposition Home Care Care Advice Given Per  Guideline GO TO ED NOW (OR PCP TRIAGE): CARE ADVICE given per COVID-19 - DIAGNOSED OR SUSPECTED (Adult) guideline. Referrals GO TO FACILITY OTHER - SPECIFY

## 2020-01-27 NOTE — Telephone Encounter (Signed)
FYI

## 2020-01-29 ENCOUNTER — Telehealth: Payer: Self-pay

## 2020-01-29 NOTE — Telephone Encounter (Signed)
Pt was diagnosed with covid last Friday. She is still running a low grade fever. Pt is unsure if covid is supposed to last this long and is requesting a call.

## 2020-02-03 NOTE — Telephone Encounter (Signed)
Discussed with patient in previous phone note

## 2020-02-18 ENCOUNTER — Other Ambulatory Visit: Payer: Self-pay | Admitting: Family Medicine

## 2020-02-18 ENCOUNTER — Ambulatory Visit: Payer: BC Managed Care – PPO | Admitting: Family Medicine

## 2020-02-18 NOTE — Telephone Encounter (Signed)
Last refill: 01/13/20 #30, 0 Last OV: 01/13/20 dx. palpitations

## 2020-02-20 MED ORDER — AMPHETAMINE-DEXTROAMPHETAMINE 10 MG PO TABS
10.0000 mg | ORAL_TABLET | Freq: Every day | ORAL | 0 refills | Status: DC
Start: 2020-02-20 — End: 2020-03-29

## 2020-03-28 ENCOUNTER — Encounter: Payer: Self-pay | Admitting: Family Medicine

## 2020-03-29 ENCOUNTER — Other Ambulatory Visit: Payer: Self-pay

## 2020-03-29 MED ORDER — AMPHETAMINE-DEXTROAMPHETAMINE 10 MG PO TABS
10.0000 mg | ORAL_TABLET | Freq: Every day | ORAL | 0 refills | Status: DC
Start: 2020-03-29 — End: 2020-05-06

## 2020-03-29 NOTE — Telephone Encounter (Signed)
Last refill: 02/20/20 #30, 0 Last OV: 01/13/20 dx. Palpitations

## 2020-05-05 ENCOUNTER — Encounter: Payer: Self-pay | Admitting: Family Medicine

## 2020-05-06 ENCOUNTER — Other Ambulatory Visit: Payer: Self-pay

## 2020-05-06 ENCOUNTER — Encounter: Payer: Self-pay | Admitting: Family Medicine

## 2020-05-06 NOTE — Telephone Encounter (Signed)
Last refill: 03/29/20 #30, 0 Last OV: 01/13/20 dx. Palpitations

## 2020-05-07 MED ORDER — AMPHETAMINE-DEXTROAMPHETAMINE 10 MG PO TABS
10.0000 mg | ORAL_TABLET | Freq: Every day | ORAL | 0 refills | Status: DC
Start: 2020-05-07 — End: 2020-06-10

## 2020-06-09 ENCOUNTER — Encounter: Payer: Self-pay | Admitting: Family Medicine

## 2020-06-10 MED ORDER — AMPHETAMINE-DEXTROAMPHETAMINE 10 MG PO TABS
10.0000 mg | ORAL_TABLET | Freq: Every day | ORAL | 0 refills | Status: DC
Start: 2020-06-10 — End: 2020-07-13

## 2020-06-10 NOTE — Telephone Encounter (Signed)
Pt requesting refill of Adderall, but also asking for increase. Please see message.

## 2020-06-14 ENCOUNTER — Telehealth: Payer: Self-pay

## 2020-06-14 NOTE — Telephone Encounter (Signed)
Called and left patient a voicemail to return call. 

## 2020-06-14 NOTE — Telephone Encounter (Signed)
Pt called asking if Dr. Jonni Sanger could approve for her mother in law to establish with her. Pt states her MIL just got out of the hospital and needs a PCP. Pt currently sees a cardiologist within [place]. Please advise.

## 2020-06-14 NOTE — Telephone Encounter (Signed)
FYI

## 2020-06-14 NOTE — Telephone Encounter (Signed)
I can take her on as a new patient; however, not sure what time frame she needs. May be hard if she needs an appointment quickly.

## 2020-06-15 NOTE — Telephone Encounter (Signed)
Spoke with patient , will have mother in law call back to try and get an appointment.

## 2020-06-15 NOTE — Telephone Encounter (Signed)
Patient called back after hours.

## 2020-07-13 ENCOUNTER — Encounter: Payer: Self-pay | Admitting: Family Medicine

## 2020-07-13 ENCOUNTER — Other Ambulatory Visit: Payer: Self-pay

## 2020-07-13 MED ORDER — AMPHETAMINE-DEXTROAMPHETAMINE 10 MG PO TABS
10.0000 mg | ORAL_TABLET | Freq: Every day | ORAL | 0 refills | Status: DC
Start: 2020-07-13 — End: 2020-08-20

## 2020-07-13 NOTE — Progress Notes (Unsigned)
Last refill 06/10/2020 Last OV 01/13/2020 dx palpitations

## 2020-07-14 ENCOUNTER — Encounter: Payer: Self-pay | Admitting: Family Medicine

## 2020-08-06 NOTE — Telephone Encounter (Signed)
Can we use same day for patient, no openings?

## 2020-08-10 NOTE — Telephone Encounter (Signed)
LVM to get scheduled.  

## 2020-08-20 ENCOUNTER — Other Ambulatory Visit: Payer: Self-pay

## 2020-08-20 ENCOUNTER — Ambulatory Visit (INDEPENDENT_AMBULATORY_CARE_PROVIDER_SITE_OTHER): Payer: BC Managed Care – PPO | Admitting: Family Medicine

## 2020-08-20 ENCOUNTER — Encounter: Payer: Self-pay | Admitting: Family Medicine

## 2020-08-20 VITALS — BP 118/80 | HR 88 | Temp 98.3°F | Ht 68.0 in | Wt 142.4 lb

## 2020-08-20 DIAGNOSIS — R002 Palpitations: Secondary | ICD-10-CM

## 2020-08-20 DIAGNOSIS — F988 Other specified behavioral and emotional disorders with onset usually occurring in childhood and adolescence: Secondary | ICD-10-CM | POA: Diagnosis not present

## 2020-08-20 DIAGNOSIS — N951 Menopausal and female climacteric states: Secondary | ICD-10-CM

## 2020-08-20 DIAGNOSIS — Z8616 Personal history of COVID-19: Secondary | ICD-10-CM

## 2020-08-20 DIAGNOSIS — F411 Generalized anxiety disorder: Secondary | ICD-10-CM

## 2020-08-20 MED ORDER — AMPHETAMINE-DEXTROAMPHETAMINE 10 MG PO TABS: 10.0000 mg | ORAL_TABLET | Freq: Every day | ORAL | 0 refills | Status: DC

## 2020-08-20 NOTE — Progress Notes (Signed)
Subjective  CC:  Chief Complaint  Patient presents with   ADD    HPI: Audrey Cox is a 55 y.o. female who presents to the office today to address the problems listed above in the chief complaint.  Patient is here today for follow up of ADD/ADHD. She is taking medication as directed and continues to feel it is beneficial. The medications continue to help with focus and attention and task completion. She denies adverse side effects; specifically no headaches, appetite suppression, weight loss, sleeping difficulty, heart palpitations, chest pain or significant weight changes.  She has never been formally tested for ADD.  Some of her symptoms may be coming from underlying anxiety symptoms.  She has failed multiple SSRIs in the past.  She does not take her ADD medications every day.  This patient does not have contraindications for stimulant use include hypertension, tachycardia, arrhythmia, psychosis, bipolar disorder, severe anorexia, and Tourette syndrome.  History of palpitations: Reviewed last note from December.  Fortunately, her symptoms resolved spontaneously shortly after her visit. LMP was back in December.  Since, she is complaining of hot flashes.  She thinks she is perimenopausal heading towards postmenopausal.  Also some memory lapses and difficulty thinking.  Flashes interfere with sleep.  She would like to discuss management strategies.  Assessment  1. Attention deficit disorder (ADD) without hyperactivity   2. Palpitation   3. Anxiety state   4. Menopausal syndrome (hot flashes)   5. Personal history of COVID-19      Plan  ADD: Possible adult ADD but we did discuss how anxiety could be her true diagnosis.  She manages behaviorally but also feels ADD medicine is helpful.  Refilled.  Low dose and tolerating well. Anxiety state: Possible underlying generalized anxiety disorder.  Has failed medications.  He has been to therapy.  Currently stable.  No panic  attacks. Palpitations: Resolved.  Possibly related to Adderall, caffeine, anxiety, perimenopausal, COVID.  She was diagnosed with COVID in December. Perimenopausal: Education given.  After visit summary with information given.  Discussed treatment strategies for next month at a physical.  She does have a gynecologist but may want to continue year.  Follow up: August for complete physical as scheduled No orders of the defined types were placed in this encounter.  Meds ordered this encounter  Medications   amphetamine-dextroamphetamine (ADDERALL) 10 MG tablet    Sig: Take 1 tablet (10 mg total) by mouth daily.    Dispense:  30 tablet    Refill:  0   amphetamine-dextroamphetamine (ADDERALL) 10 MG tablet    Sig: Take 1 tablet (10 mg total) by mouth daily.    Dispense:  30 tablet    Refill:  0      I reviewed the patients updated PMH, FH, and SocHx.    Patient Active Problem List   Diagnosis Date Noted   Attention deficit disorder (ADD) without hyperactivity 08/15/2019   Perimenopausal symptoms 04/30/2019   Attention and concentration deficit 04/30/2019   History of depression 04/30/2019   Family history of malignant melanoma 04/30/2019   Abnormal cervical Papanicolaou smear 02/18/2018   Irritable bowel syndrome 02/18/2018   Anxiety state 11/17/2014   Rhinitis, allergic 11/17/2014   Current Meds  Medication Sig   ALPRAZolam (XANAX) 0.25 MG tablet Take 0.25 mg by mouth as needed.    fexofenadine (ALLEGRA) 180 MG tablet Take 180 mg by mouth as needed.    ibuprofen (ADVIL) 200 MG tablet Take 200 mg by mouth as  needed.    [DISCONTINUED] amphetamine-dextroamphetamine (ADDERALL) 10 MG tablet Take 1 tablet (10 mg total) by mouth daily.    Allergies: Patient is allergic to sulfa antibiotics. Family History: Patient family history includes Arthritis in her father, maternal grandmother, and paternal grandmother; Breast cancer in her maternal aunt; Colon polyps in her father and paternal  grandmother; Depression in her mother; Healthy in her daughter; Heart disease in her maternal grandfather, maternal grandmother, and mother; Hyperlipidemia in her father; Hypertension in her maternal grandmother; Melanoma in her maternal aunt and maternal grandmother; Mood Disorder in her sister and son; Rheum arthritis in her paternal grandfather. Social History:  Patient  reports that she has never smoked. She has never used smokeless tobacco. She reports current alcohol use. She reports that she does not use drugs.  Review of Systems: Constitutional: Negative for fever malaise or anorexia Cardiovascular: negative for chest pain Respiratory: negative for SOB or persistent cough Gastrointestinal: negative for abdominal pain  Objective  Vitals: BP 118/80 (BP Location: Left Arm, Patient Position: Sitting, Cuff Size: Normal)   Pulse 88   Temp 98.3 F (36.8 C) (Temporal)   Ht 5\' 8"  (1.727 m)   Wt 142 lb 6.1 oz (64.6 kg)   LMP 01/18/2020   SpO2 97%   BMI 21.65 kg/m  General: no acute distress , A&Ox3 Psych: Alert and oriented x3, normal speech.  Normal affect   Commons side effects, risks, benefits, and alternatives for medications and treatment plan prescribed today were discussed, and the patient expressed understanding of the given instructions. Patient is instructed to call or message via MyChart if he/she has any questions or concerns regarding our treatment plan. No barriers to understanding were identified. We discussed Red Flag symptoms and signs in detail. Patient expressed understanding regarding what to do in case of urgent or emergency type symptoms.  Medication list was reconciled, printed and provided to the patient in AVS. Patient instructions and summary information was reviewed with the patient as documented in the AVS. This note was prepared with assistance of Dragon voice recognition software. Occasional wrong-word or sound-a-like substitutions may have occurred due to the  inherent limitations of voice recognition software

## 2020-08-20 NOTE — Patient Instructions (Signed)
Please follow up as scheduled for your next visit with me: 09/08/2020   I have sent  2 months of Adderall to your pharmacy.   If you have any questions or concerns, please don't hesitate to send me a message via MyChart or call the office at [phone]. Thank you for visiting with [place] today! It's our pleasure caring for you.    Menopause Menopause is the normal time of a woman's life when menstrual periods stop completely. It marks the natural end to a woman's ability to become pregnant. It can be defined as the absence of a menstrual period for 12 months without another medical cause. The transition to menopause (perimenopause) most often happens between the ages of 52 and 73, and can last for many years. During perimenopause, hormone levels change in your body, which can cause symptoms and affect your health. Menopause may increase your risk for: Weakened bones (osteoporosis), which causes fractures. Depression. Hardening and narrowing of the arteries (atherosclerosis), which can cause heart attacks and strokes. What are the causes? This condition is usually caused by a natural change in hormone levels that happens as you get older. The condition may also be caused by changes that are not natural, including: Surgery to remove both ovaries (surgical menopause). Side effects from some medicines, such as chemotherapy used to treat cancer (chemical menopause). What increases the risk? This condition is more likely to start at an earlier age if you have certain medical conditions or have undergone treatments, including: A tumor of the pituitary gland in the brain. A disease that affects the ovaries and hormones. Certain cancer treatments, such as chemotherapy or hormone therapy, or radiation therapy on the pelvis. Heavy smoking and excessive alcohol use. Family history of early menopause. This condition is also more likely to develop earlier in women who are verythin. What are the signs or  symptoms? Symptoms of this condition include: Hot flashes. Irregular menstrual periods. Night sweats. Changes in feelings about sex. This could be a decrease in sex drive or an increased discomfort around your sexuality. Vaginal dryness and thinning of the vaginal walls. This may cause painful sex. Dryness of the skin and development of wrinkles. Headaches. Problems sleeping (insomnia). Mood swings or irritability. Memory problems. Weight gain. Hair growth on the face and chest. Bladder infections or problems with urinating. How is this diagnosed? This condition is diagnosed based on your medical history, a physical exam, your age, your menstrual history, and your symptoms. Hormone tests may also bedone. How is this treated? In some cases, no treatment is needed. You and your health care provider should make a decision together about whether treatment is necessary. Treatment will be based on your individual condition and preferences. Treatment for this condition focuses on managing symptoms. Treatment may include: Menopausal hormone therapy (MHT). Medicines to treat specific symptoms or complications. Acupuncture. Vitamin or herbal supplements. Before starting treatment, make sure to let your health care provider know if you have a personal or family history of these conditions: Heart disease. Breast cancer. Blood clots. Diabetes. Osteoporosis. Follow these instructions at home: Lifestyle Do not use any products that contain nicotine or tobacco, such as cigarettes, e-cigarettes, and chewing tobacco. If you need help quitting, ask your health care provider. Get at least 30 minutes of physical activity on 5 or more days each week. Avoid alcoholic and caffeinated beverages, as well as spicy foods. This may help prevent hot flashes. Get 7-8 hours of sleep each night. If you have hot flashes, try:  Dressing in layers. Avoiding things that may trigger hot flashes, such as spicy food,  warm places, or stress. Taking slow, deep breaths when a hot flash starts. Keeping a fan in your home and office. Find ways to manage stress, such as deep breathing, meditation, or journaling. Consider going to group therapy with other women who are having menopause symptoms. Ask your health care provider about recommended group therapy meetings. Eating and drinking  Eat a healthy, balanced diet that contains whole grains, lean protein, low-fat dairy, and plenty of fruits and vegetables. Your health care provider may recommend adding more soy to your diet. Foods that contain soy include tofu, tempeh, and soy milk. Eat plenty of foods that contain calcium and vitamin D for bone health. Items that are rich in calcium include low-fat milk, yogurt, beans, almonds, sardines, broccoli, and kale.  Medicines Take over-the-counter and prescription medicines only as told by your health care provider. Talk with your health care provider before starting any herbal supplements. If prescribed, take vitamins and supplements as told by your health care provider. General instructions  Keep track of your menstrual periods, including: When they occur. How heavy they are and how long they last. How much time passes between periods. Keep track of your symptoms, noting when they start, how often you have them, and how long they last. Use vaginal lubricants or moisturizers to help with vaginal dryness and improve comfort during sex. Keep all follow-up visits. This is important. This includes any group therapy or counseling.  Contact a health care provider if: You are still having menstrual periods after age 41. You have pain during sex. You have not had a period for 12 months and you develop vaginal bleeding. Get help right away if you have: Severe depression. Excessive vaginal bleeding. Pain when you urinate. A fast or irregular heartbeat (palpitations). Severe headaches. Abdominal pain or severe  indigestion. Summary Menopause is a normal time of life when menstrual periods stop completely. It is usually defined as the absence of a menstrual period for 12 months without another medical cause. The transition to menopause (perimenopause) most often happens between the ages of 11 and 68 and can last for several years. Symptoms can be managed through medicines, lifestyle changes, and complementary therapies such as acupuncture. Eat a balanced diet that is rich in nutrients to promote bone health and heart health and to manage symptoms during menopause. This information is not intended to replace advice given to you by your health care provider. Make sure you discuss any questions you have with your healthcare provider. Document Revised: 10/24/2019 Document Reviewed: 07/10/2019 Elsevier Patient Education  [place].

## 2020-09-08 ENCOUNTER — Ambulatory Visit (INDEPENDENT_AMBULATORY_CARE_PROVIDER_SITE_OTHER): Payer: BC Managed Care – PPO | Admitting: Family Medicine

## 2020-09-08 ENCOUNTER — Encounter: Payer: BC Managed Care – PPO | Admitting: Family Medicine

## 2020-09-08 ENCOUNTER — Other Ambulatory Visit: Payer: Self-pay

## 2020-09-08 ENCOUNTER — Encounter: Payer: Self-pay | Admitting: Family Medicine

## 2020-09-08 VITALS — BP 115/81 | HR 88 | Temp 98.4°F | Ht 68.0 in | Wt 142.4 lb

## 2020-09-08 DIAGNOSIS — F988 Other specified behavioral and emotional disorders with onset usually occurring in childhood and adolescence: Secondary | ICD-10-CM | POA: Diagnosis not present

## 2020-09-08 DIAGNOSIS — Z23 Encounter for immunization: Secondary | ICD-10-CM

## 2020-09-08 DIAGNOSIS — Z0000 Encounter for general adult medical examination without abnormal findings: Secondary | ICD-10-CM | POA: Diagnosis not present

## 2020-09-08 DIAGNOSIS — N951 Menopausal and female climacteric states: Secondary | ICD-10-CM

## 2020-09-08 DIAGNOSIS — L700 Acne vulgaris: Secondary | ICD-10-CM | POA: Insufficient documentation

## 2020-09-08 DIAGNOSIS — F411 Generalized anxiety disorder: Secondary | ICD-10-CM | POA: Diagnosis not present

## 2020-09-08 LAB — CBC WITH DIFFERENTIAL/PLATELET
Basophils Absolute: 0.1 10*3/uL (ref 0.0–0.1)
Basophils Relative: 0.8 % (ref 0.0–3.0)
Eosinophils Absolute: 0.1 10*3/uL (ref 0.0–0.7)
Eosinophils Relative: 1.6 % (ref 0.0–5.0)
HCT: 45.4 % (ref 36.0–46.0)
Hemoglobin: 15.4 g/dL — ABNORMAL HIGH (ref 12.0–15.0)
Lymphocytes Relative: 23.1 % (ref 12.0–46.0)
Lymphs Abs: 1.6 10*3/uL (ref 0.7–4.0)
MCHC: 34 g/dL (ref 30.0–36.0)
MCV: 88.6 fl (ref 78.0–100.0)
Monocytes Absolute: 0.4 10*3/uL (ref 0.1–1.0)
Monocytes Relative: 6.2 % (ref 3.0–12.0)
Neutro Abs: 4.8 10*3/uL (ref 1.4–7.7)
Neutrophils Relative %: 68.3 % (ref 43.0–77.0)
Platelets: 351 10*3/uL (ref 150.0–400.0)
RBC: 5.12 Mil/uL — ABNORMAL HIGH (ref 3.87–5.11)
RDW: 13.1 % (ref 11.5–15.5)
WBC: 7 10*3/uL (ref 4.0–10.5)

## 2020-09-08 LAB — FOLLICLE STIMULATING HORMONE: FSH: 55.5 m[IU]/mL

## 2020-09-08 LAB — LIPID PANEL
Cholesterol: 205 mg/dL — ABNORMAL HIGH (ref 0–200)
HDL: 65 mg/dL (ref >39.00)
LDL Cholesterol: 109 mg/dL — ABNORMAL HIGH (ref 0–99)
NonHDL: 139.87
Total CHOL/HDL Ratio: 3
Triglycerides: 153 mg/dL — ABNORMAL HIGH (ref 0.0–149.0)
VLDL: 30.6 mg/dL (ref 0.0–40.0)

## 2020-09-08 LAB — COMPREHENSIVE METABOLIC PANEL
ALT: 16 U/L (ref 0–35)
AST: 16 U/L (ref 0–37)
Albumin: 4.8 g/dL (ref 3.5–5.2)
Alkaline Phosphatase: 88 U/L (ref 39–117)
BUN: 17 mg/dL (ref 6–23)
CO2: 28 mEq/L (ref 19–32)
Calcium: 9.9 mg/dL (ref 8.4–10.5)
Chloride: 101 mEq/L (ref 96–112)
Creatinine, Ser: 0.8 mg/dL (ref 0.40–1.20)
GFR: 84.59 mL/min (ref >60.00)
Glucose, Bld: 89 mg/dL (ref 70–99)
Potassium: 3.9 mEq/L (ref 3.5–5.1)
Sodium: 140 mEq/L (ref 135–145)
Total Bilirubin: 0.6 mg/dL (ref 0.2–1.2)
Total Protein: 7.8 g/dL (ref 6.0–8.3)

## 2020-09-08 LAB — TSH: TSH: 0.63 u[IU]/mL (ref 0.35–5.50)

## 2020-09-08 NOTE — Progress Notes (Signed)
Subjective  Chief Complaint  Patient presents with   Annual Exam    HPI: Audrey Cox is a 55 y.o. female who presents to [facility] today for a Female Wellness Visit. She also has the concerns and/or needs as listed above in the chief complaint. These will be addressed in addition to the Health Maintenance Visit.   Wellness Visit: annual visit with health maintenance review and exam without Pap  Health maintenance: Patient is scheduled a GYN appointment in September for female wellness including mammogram.  Other screens are up-to-date.  Overall she lives active, healthy lifestyle.  Exercises regularly.  Eligible for Shingrix Chronic disease f/u and/or acute problem visit: (deemed necessary to be done in addition to the wellness visit): Perimenopausal symptoms: We started this conversation at her last visit.  She admits to intermittent hot flashes, brain fog, irritability and mild sleep problems.  She reports her symptoms as mild to moderate.  She would prefer not to use medications.  Her last menstrual cycle was in December of last year. Anxiety and ADD are well controlled  Assessment  1. Annual physical exam   2. Perimenopausal symptoms   3. Anxiety state   4. Attention deficit disorder (ADD) without hyperactivity   5. Need for shingles vaccine      Plan  Female Wellness Visit: Age appropriate Health Maintenance and Prevention measures were discussed with patient. Included topics are cancer screening recommendations, ways to keep healthy (see AVS) including dietary and exercise recommendations, regular eye and dental care, use of seat belts, and avoidance of moderate alcohol use and tobacco use.  BMI: discussed patient's BMI and encouraged positive lifestyle modifications to help get to or maintain a target BMI. HM needs and immunizations were addressed and ordered. See below for orders. See HM and immunization section for updates. Routine labs and  screening tests ordered including cmp, cbc and lipids where appropriate. Discussed recommendations regarding Vit D and calcium supplementation (see AVS) Perimenopausal symptoms: Discussed typical symptoms and management strategies.  She will manage behaviorally and try over-the-counter black cohosh.  She will follow-up with GYN if worsens.   Follow up: Return in about 6 months (around 03/11/2021) for follow up on ADD and 2nd shingrix vacicne.  Orders Placed This Encounter  Procedures   Varicella-zoster vaccine IM (Shingrix)   CBC with Differential/Platelet   Comprehensive metabolic panel   Hepatitis C antibody   Follicle stimulating hormone   Lipid panel   TSH   No orders of the defined types were placed in this encounter.     Body mass index is 21.65 kg/m. Wt Readings from Last 3 Encounters:  09/08/20 142 lb 6.1 oz (64.6 kg)  08/20/20 142 lb 6.1 oz (64.6 kg)  01/13/20 144 lb 3.2 oz (65.4 kg)     Patient Active Problem List   Diagnosis Date Noted   Acne vulgaris 09/08/2020   Attention deficit disorder (ADD) without hyperactivity 08/15/2019   Perimenopausal symptoms 04/30/2019   History of depression 04/30/2019    Mild; didn't tolerate SSRIs x 2     Family history of malignant melanoma 04/30/2019    Maternal aunt and grandmother. Sees Derm annually     Abnormal cervical Papanicolaou smear 02/18/2018   Anxiety state 11/17/2014   Rhinitis, allergic 11/17/2014   Health Maintenance  Topic Date Due   Pneumococcal Vaccine 59-77 Years old (1 - PCV) Never done   Hepatitis C Screening  Never done   MAMMOGRAM  08/19/2020  INFLUENZA VACCINE  09/06/2020   COVID-19 Vaccine (3 - Pfizer risk series) 09/24/2020 (Originally 10/23/2019)   Zoster Vaccines- Shingrix (2 of 2) 11/03/2020   PAP SMEAR-Modifier  08/20/2022   COLONOSCOPY (Pts 45-41yr Insurance coverage will need to be confirmed)  06/04/2026   TETANUS/TDAP  04/29/2029   HIV Screening  Completed   HPV VACCINES  Aged Out    Immunization History  Administered Date(s) Administered   Influenza,inj,Quad PF,6+ Mos 11/17/2014, 12/21/2016, 01/13/2020   PFIZER(Purple Top)SARS-COV-2 Vaccination 09/04/2019, 09/25/2019   Tdap 04/30/2019   Zoster Recombinat (Shingrix) 09/08/2020   We updated and reviewed the patient's past history in detail and it is documented below. Allergies: Patient is allergic to sulfa antibiotics. Past Medical History Patient  has a past medical history of Allergy, Anxiety, Arthritis, Blood transfusion without reported diagnosis, Family history of malignant melanoma (04/30/2019), Heart murmur, and History of depression (04/30/2019). Past Surgical History Patient  has a past surgical history that includes Uterine fibroid surgery ([id number] and Wisdom tooth extraction. Family History: Patient family history includes Arthritis in her father, maternal grandmother, and paternal grandmother; Breast cancer in her maternal aunt; Colon polyps in her father and paternal grandmother; Depression in her mother; Healthy in her daughter; Heart disease in her maternal grandfather, maternal grandmother, and mother; Hyperlipidemia in her father; Hypertension in her maternal grandmother; Melanoma in her maternal aunt and maternal grandmother; Mood Disorder in her sister and son; Rheum arthritis in her paternal grandfather. Social History:  Patient  reports that she has never smoked. She has never used smokeless tobacco. She reports current alcohol use. She reports that she does not use drugs.  Review of Systems: Constitutional: negative for fever or malaise Ophthalmic: negative for photophobia, double vision or loss of vision Cardiovascular: negative for chest pain, dyspnea on exertion, or new LE swelling Respiratory: negative for SOB or persistent cough Gastrointestinal: negative for abdominal pain, change in bowel habits or melena Genitourinary: negative for dysuria or gross hematuria, no abnormal uterine bleeding  or disharge Musculoskeletal: negative for new gait disturbance or muscular weakness Integumentary: negative for new or persistent rashes, no breast lumps Neurological: negative for TIA or stroke symptoms Psychiatric: negative for SI or delusions Allergic/Immunologic: negative for hives  Patient Care Team    Relationship Specialty Notifications Start End  ALeamon Arnt MD PCP - General Family Medicine  04/30/19   HMolli Posey MD Consulting Physician Obstetrics and Gynecology  04/30/19   HDevra Dopp MD Referring Physician Dermatology  04/30/19     Objective  Vitals: BP 115/81   Pulse 88   Temp 98.4 F (36.9 C)   Ht '5\' 8"'$  (1.727 m)   Wt 142 lb 6.1 oz (64.6 kg)   SpO2 99%   BMI 21.65 kg/m  General:  Well developed, well nourished, no acute distress  Psych:  Alert and orientedx3,normal mood and affect HEENT:  Normocephalic, atraumatic, non-icteric sclera,  supple neck without adenopathy, mass or thyromegaly Cardiovascular:  Normal S1, S2, RRR without gallop, rub or murmur Respiratory:  Good breath sounds bilaterally, CTAB with normal respiratory effort Gastrointestinal: normal bowel sounds, soft, non-tender, no noted masses. No HSM MSK: no deformities, contusions. Joints are without erythema or swelling.  Multiple moles Skin:  Warm, no rashes or suspicious lesions noted Neurologic:    Mental status is normal. CN 2-11 are normal. Gross motor and sensory exams are normal. Normal gait. No tremor   Commons side effects, risks, benefits, and alternatives for medications and treatment plan prescribed today were  discussed, and the patient expressed understanding of the given instructions. Patient is instructed to call or message via MyChart if he/she has any questions or concerns regarding our treatment plan. No barriers to understanding were identified. We discussed Red Flag symptoms and signs in detail. Patient expressed understanding regarding what to do in case of urgent  or emergency type symptoms.  Medication list was reconciled, printed and provided to the patient in AVS. Patient instructions and summary information was reviewed with the patient as documented in the AVS. This note was prepared with assistance of Dragon voice recognition software. Occasional wrong-word or sound-a-like substitutions may have occurred due to the inherent limitations of voice recognition software  This visit occurred during the SARS-CoV-2 public health emergency.  Safety protocols were in place, including screening questions prior to the visit, additional usage of staff PPE, and extensive cleaning of exam room while observing appropriate contact time as indicated for disinfecting solutions.

## 2020-09-08 NOTE — Patient Instructions (Signed)
Please return in 6 months for ADD recheck and 2nd Shingrix vaccination.   I will release your lab results to you on your MyChart account with further instructions. Please reply with any questions.    If you have any questions or concerns, please don't hesitate to send me a message via MyChart or call the office at [phone]. Thank you for visiting with [place] today! It's our pleasure caring for you.   Preventive Care 33-53 Years Old, Female Preventive care refers to lifestyle choices and visits with your health care provider that can promote health and wellness. This includes: A yearly physical exam. This is also called an annual wellness visit. Regular dental and eye exams. Immunizations. Screening for certain conditions. Healthy lifestyle choices, such as: Eating a healthy diet. Getting regular exercise. Not using drugs or products that contain nicotine and tobacco. Limiting alcohol use. What can I expect for my preventive care visit? Physical exam Your health care provider will check your: Height and weight. These may be used to calculate your BMI (body mass index). BMI is a measurement that tells if you are at a healthy weight. Heart rate and blood pressure. Body temperature. Skin for abnormal spots. Counseling Your health care provider may ask you questions about your: Past medical problems. Family's medical history. Alcohol, tobacco, and drug use. Emotional well-being. Home life and relationship well-being. Sexual activity. Diet, exercise, and sleep habits. Work and work Statistician. Access to firearms. Method of birth control. Menstrual cycle. Pregnancy history. What immunizations do I need?  Vaccines are usually given at various ages, according to a schedule. Your health care provider will recommend vaccines for you based on your age, medicalhistory, and lifestyle or other factors, such as travel or where you work. What tests do I need? Blood tests Lipid and  cholesterol levels. These may be checked every 5 years, or more often if you are over 83 years old. Hepatitis C test. Hepatitis B test. Screening Lung cancer screening. You may have this screening every year starting at age 53 if you have a 30-pack-year history of smoking and currently smoke or have quit within the past 15 years. Colorectal cancer screening. All adults should have this screening starting at age 52 and continuing until age 62. Your health care provider may recommend screening at age 53 if you are at increased risk. You will have tests every 1-10 years, depending on your results and the type of screening test. Diabetes screening. This is done by checking your blood sugar (glucose) after you have not eaten for a while (fasting). You may have this done every 1-3 years. Mammogram. This may be done every 1-2 years. Talk with your health care provider about when you should start having regular mammograms. This may depend on whether you have a family history of breast cancer. BRCA-related cancer screening. This may be done if you have a family history of breast, ovarian, tubal, or peritoneal cancers. Pelvic exam and Pap test. This may be done every 3 years starting at age 18. Starting at age 95, this may be done every 5 years if you have a Pap test in combination with an HPV test. Other tests STD (sexually transmitted disease) testing, if you are at risk. Bone density scan. This is done to screen for osteoporosis. You may have this scan if you are at high risk for osteoporosis. Talk with your health care provider about your test results, treatment options,and if necessary, the need for more tests. Follow these instructions at home:  Eating and drinking  Eat a diet that includes fresh fruits and vegetables, whole grains, lean protein, and low-fat dairy products. Take vitamin and mineral supplements as recommended by your health care provider. Do not drink alcohol if: Your health  care provider tells you not to drink. You are pregnant, may be pregnant, or are planning to become pregnant. If you drink alcohol: Limit how much you have to 0-1 drink a day. Be aware of how much alcohol is in your drink. In the [place] one drink equals one 12 oz bottle of beer (355 mL), one 5 oz glass of wine (148 mL), or one 1 oz glass of hard liquor (44 mL).  Lifestyle Take daily care of your teeth and gums. Brush your teeth every morning and night with fluoride toothpaste. Floss one time each day. Stay active. Exercise for at least 30 minutes 5 or more days each week. Do not use any products that contain nicotine or tobacco, such as cigarettes, e-cigarettes, and chewing tobacco. If you need help quitting, ask your health care provider. Do not use drugs. If you are sexually active, practice safe sex. Use a condom or other form of protection to prevent STIs (sexually transmitted infections). If you do not wish to become pregnant, use a form of birth control. If you plan to become pregnant, see your health care provider for a prepregnancy visit. If told by your health care provider, take low-dose aspirin daily starting at age 11. Find healthy ways to cope with stress, such as: Meditation, yoga, or listening to music. Journaling. Talking to a trusted person. Spending time with friends and family. Safety Always wear your seat belt while driving or riding in a vehicle. Do not drive: If you have been drinking alcohol. Do not ride with someone who has been drinking. When you are tired or distracted. While texting. Wear a helmet and other protective equipment during sports activities. If you have firearms in your house, make sure you follow all gun safety procedures. What's next? Visit your health care provider once a year for an annual wellness visit. Ask your health care provider how often you should have your eyes and teeth checked. Stay up to date on all vaccines. This information is not  intended to replace advice given to you by your health care provider. Make sure you discuss any questions you have with your healthcare provider. Document Revised: 10/28/2019 Document Reviewed: 10/04/2017 Elsevier Patient Education  2022 [employer].

## 2020-09-09 LAB — HEPATITIS C ANTIBODY
Hepatitis C Ab: NONREACTIVE
SIGNAL TO CUT-OFF: 0.01 (ref <1.00)

## 2020-10-13 DIAGNOSIS — Z6822 Body mass index (BMI) 22.0-22.9, adult: Secondary | ICD-10-CM | POA: Diagnosis not present

## 2020-10-13 DIAGNOSIS — Z01419 Encounter for gynecological examination (general) (routine) without abnormal findings: Secondary | ICD-10-CM | POA: Diagnosis not present

## 2020-10-13 DIAGNOSIS — Z1231 Encounter for screening mammogram for malignant neoplasm of breast: Secondary | ICD-10-CM | POA: Diagnosis not present

## 2020-10-18 ENCOUNTER — Other Ambulatory Visit: Payer: Self-pay | Admitting: Family Medicine

## 2020-10-18 MED ORDER — AMPHETAMINE-DEXTROAMPHETAMINE 10 MG PO TABS: 10.0000 mg | ORAL_TABLET | Freq: Every day | ORAL | 0 refills | Status: DC

## 2020-10-27 DIAGNOSIS — R87610 Atypical squamous cells of undetermined significance on cytologic smear of cervix (ASC-US): Secondary | ICD-10-CM | POA: Diagnosis not present

## 2020-11-17 ENCOUNTER — Encounter: Payer: BC Managed Care – PPO | Admitting: Family Medicine

## 2021-01-07 ENCOUNTER — Telehealth: Payer: Self-pay

## 2021-01-07 NOTE — Telephone Encounter (Signed)
fyi

## 2021-01-07 NOTE — Telephone Encounter (Signed)
Patient Name: Audrey Cox Gender: Female DOB: Sep 26, 1965 Age: 55 Y 11 M 15 D Return Phone Number: [phone] (Primary) Address: City/ State/ Zip: [place]  [address] Client [facility] [place] Client Site [facility] [facility] Provider Billey Chang- MD Contact Type Call Who Is Calling Patient / Member / Family / Caregiver Call Type Triage / Clinical Relationship To Patient Self Return Phone Number [phone] (Primary) Chief Complaint Rectal Bleeding Reason for Call Symptomatic / Request for [place] was trying to trans call from office when patient hung up / no appt today patient has rectal bleeding with stomach pain and stool is very thin Translation No Nurse Assessment Nurse: Sherrell Puller, RN, Amy Date/Time Eilene Ghazi Time): 01/07/2021 8:45:32 AM Confirm and document reason for call. If symptomatic, describe symptoms. ---Caller states she started noticing when she was wiping some pink on the toilet paper but did not see anything in the toilet. Stool consistency has changed and stool looks like "a lighter tan color or khaki color" in the last week. Last night did see blood in her stool. Intermittent pelvic and ovary pain-says she's going through menopause. No pelvic pain currently but when she does have it says the pain level is 5/10. Says she's had extra flatus lately. Does the patient have any new or worsening symptoms? ---Yes Will a triage be completed? ---Yes Related visit to physician within the last 2 weeks? ---No Does the PT have any chronic conditions? (i.e. diabetes, asthma, this includes High risk factors for pregnancy, etc.) ---No Is the patient pregnant or possibly pregnant? (Ask all females between the ages of 36-55) ---No Is this a behavioral health or substance abuse call? ---No PLEASE NOTE: All timestamps contained within this report are represented as [place]  Standard Time. CONFIDENTIALTY NOTICE: This fax transmission is intended only for the addressee. It contains information that is legally privileged, confidential or otherwise protected from use or disclosure. If you are not the intended recipient, you are strictly prohibited from reviewing, disclosing, copying using or disseminating any of this information or taking any action in reliance on or regarding this information. If you have received this fax in error, please notify us immediately by telephone so that we can arrange for its return to [place]. Phone: [phone], Toll-Free: [phone], Fax: [phone] Page: 2 of 2 Call Id: [id number] Guidelines Guideline Title Affirmed Question Affirmed Notes Nurse Date/Time Eilene Ghazi Time) Rectal Bleeding MILD rectal bleeding (more than just a few drops or streaks) Sherrell Puller, RN, Amy 01/07/2021 8:50:26 AM Disp. Time Eilene Ghazi Time) Disposition Final User 01/07/2021 8:56:43 AM SEE PCP WITHIN 3 DAYS Yes Sherrell Puller, RN, Amy Caller Disagree/Comply Comply Caller Understands Yes PreDisposition Did not know what to do Care Advice Given Per Guideline SEE PCP WITHIN 3 DAYS: * You need to be seen within 2 or 3 days. TO SOFTEN STOOLS AND TREAT CONSTIPATION: * Drink adequate liquids (6-8 glasses of water a day) * Exercise regularly (even a daily 15 minute walk!) CALL BACK IF: * Dizziness occurs * Bleeding increases * You become worse CARE ADVICE given per Rectal Bleeding (Adult) guideline. Referrals REFERRED TO PCP OFFICE

## 2021-01-07 NOTE — Telephone Encounter (Signed)
Pt is scheduled for 12/5.

## 2021-01-10 ENCOUNTER — Ambulatory Visit (INDEPENDENT_AMBULATORY_CARE_PROVIDER_SITE_OTHER): Payer: BC Managed Care – PPO | Admitting: Physician Assistant

## 2021-01-10 ENCOUNTER — Other Ambulatory Visit: Payer: Self-pay

## 2021-01-10 ENCOUNTER — Encounter: Payer: Self-pay | Admitting: Physician Assistant

## 2021-01-10 VITALS — BP 116/74 | HR 90 | Temp 98.0°F | Ht 68.0 in | Wt 151.2 lb

## 2021-01-10 DIAGNOSIS — R194 Change in bowel habit: Secondary | ICD-10-CM | POA: Diagnosis not present

## 2021-01-10 DIAGNOSIS — K625 Hemorrhage of anus and rectum: Secondary | ICD-10-CM

## 2021-01-10 NOTE — Progress Notes (Signed)
Audrey Cox is a 55 y.o. female here for rectal bleeding.    History of Present Illness:   Chief Complaint  Patient presents with   Rectal Bleeding    Pt c/o rectal bleeding with bowel movements since 11/1; stools not same as typical. Describes not as formed and color is lighter. Feelings of being bloated with gas; has experienced pelvic pain periodically;   Declined flu shot for today   Rectal Bleeding  Audrey Cox presents with c/o rectal bleeding upon wiping after a BM in the first week of November. According to [facility], she didn't seen any blood in the toilet at that time. This unfortunately changed when she noticed blood in her stool following a BM as well as increased amounts of blood upon wiping. Besides this she has been noticing her stool has been lighter in color, almost khaki like and thinner/choppy.   In addition to rectal bleeding, she has also been having excessive flatulence and lower abdominal pain. Audrey Cox also states she has been having intermittent pelvic and ovary pain. She wasn't sure if this was something to be concerned about considering she is going through menopause. The worse the pain has gotten was rated a 5 out of 10. Due to her Fhx of colon polyps, she has had a colonoscopy completed which found a 5 mm polyp in her sigmoid colon that was sessile and removed at that time. Denies dizziness, constipation, diarrhea, lightheadedness, nausea, unintentional weight loss, or vomiting.   She had a colonoscopy in April 2021 --she had a 5 mm polyp removed.  She was instructed to repeat colonoscopy in 7 years, 2028.   Past Medical History:  Diagnosis Date   Allergy    Anxiety    Arthritis    cervical spine and lumbar spine per patient(diagnosed by DC)   Blood transfusion without reported diagnosis    1993 post-op fibroid excision   Family history of malignant melanoma 04/30/2019   Maternal aunt and grandmother. Sees Derm annually   Heart murmur    as a child     History of depression 04/30/2019   Mild; didn't tolerate SSRIs x 2     Social History   Tobacco Use   Smoking status: Never   Smokeless tobacco: Never  Vaping Use   Vaping Use: Never used  Substance Use Topics   Alcohol use: Yes    Alcohol/week: 0.0 standard drinks    Comment: occ wine   Drug use: Never    Past Surgical History:  Procedure Laterality Date   UTERINE FIBROID SURGERY  [phone]   WISDOM TOOTH EXTRACTION      Family History  Problem Relation Age of Onset   Depression Mother    Heart disease Mother    Colon polyps Mother    Arthritis Father    Hyperlipidemia Father    Colon polyps Father    Mood Disorder Sister    Arthritis Maternal Grandmother    Heart disease Maternal Grandmother    Hypertension Maternal Grandmother    Melanoma Maternal Grandmother    Heart disease Maternal Grandfather    Arthritis Paternal Grandmother    Colon polyps Paternal Grandmother    Rheum arthritis Paternal Grandfather    Healthy Daughter    Mood Disorder Son    Breast cancer Maternal Aunt    Melanoma Maternal Aunt    Colon cancer Neg Hx    Esophageal cancer Neg Hx    Rectal cancer Neg Hx    Stomach cancer Neg  Hx     Allergies  Allergen Reactions   Sulfa Antibiotics Hives    Current Medications:   Current Outpatient Medications:    amphetamine-dextroamphetamine (ADDERALL) 10 MG tablet, Take 1 tablet (10 mg total) by mouth daily., Disp: 30 tablet, Rfl: 0   fexofenadine (ALLEGRA) 180 MG tablet, Take 180 mg by mouth as needed. , Disp: , Rfl:    ibuprofen (ADVIL) 200 MG tablet, Take 200 mg by mouth as needed. , Disp: , Rfl:    ALPRAZolam (XANAX) 0.25 MG tablet, Take 0.25 mg by mouth as needed.  (Patient not taking: Reported on 01/10/2021), Disp: , Rfl:    amphetamine-dextroamphetamine (ADDERALL) 10 MG tablet, Take 1 tablet (10 mg total) by mouth daily. (Patient not taking: Reported on 01/10/2021), Disp: 30 tablet, Rfl: 0   Review of Systems:   ROS Negative unless  otherwise specified per HPI. Vitals:   Vitals:   01/10/21 1539  BP: 116/74  Pulse: 90  Temp: 98 F (36.7 C)  TempSrc: Temporal  SpO2: 98%  Weight: 151 lb 3.2 oz (68.6 kg)  Height: 5\' 8"  (1.727 m)     Body mass index is 22.99 kg/m.  Physical Exam:   Physical Exam Vitals and nursing note reviewed.  Constitutional:      General: She is not in acute distress.    Appearance: She is well-developed. She is not ill-appearing or toxic-appearing.  Cardiovascular:     Rate and Rhythm: Normal rate and regular rhythm.     Pulses: Normal pulses.     Heart sounds: Normal heart sounds, S1 normal and S2 normal.  Pulmonary:     Effort: Pulmonary effort is normal.     Breath sounds: Normal breath sounds.  Skin:    General: Skin is warm and dry.  Neurological:     Mental Status: She is alert.     GCS: GCS eye subscore is 4. GCS verbal subscore is 5. GCS motor subscore is 6.  Psychiatric:        Speech: Speech normal.        Behavior: Behavior normal. Behavior is cooperative.    Assessment and Plan:   Rectal Bleeding; Bowel habit changes No red flags on exam. She is in NAD and no acute abdomen on my exam. She declined rectal exam today given that we are referring her to gastroenterology We will update her CBC and iron levels today per her request for evaluation, we will trend her hemoglobin to see if she has had any significant blood loss Handout provided to discuss better bowel habits and use of fiber If new or worsening symptoms before she gets in with GI, recommend she let us know  I,Havlyn C Ratchford,acting as a scribe for [employer], PA.,have documented all relevant documentation on the behalf of Audrey Coke, PA,as directed by  Audrey Coke, PA while in the presence of Audrey Cox, [place].  I, Audrey Cox, [place], have reviewed all documentation for this visit. The documentation on 01/10/21 for the exam, diagnosis, procedures, and orders are all accurate and  complete.   Audrey Coke, PA-C

## 2021-01-10 NOTE — Patient Instructions (Signed)
It was great to see you!  We will get blood work done today  I am putting in a referral for gastroenterology -- if you do not hear anything by the end of the week, please give them a call: [phone]  Take care,  [name] PA-C

## 2021-01-11 LAB — IBC + FERRITIN
Ferritin: 34.2 ng/mL (ref 10.0–291.0)
Iron: 84 ug/dL (ref 42–145)
Saturation Ratios: 20.2 % (ref 20.0–50.0)
TIBC: 415.8 ug/dL (ref 250.0–450.0)
Transferrin: 297 mg/dL (ref 212.0–360.0)

## 2021-01-11 LAB — CBC WITH DIFFERENTIAL/PLATELET
Basophils Absolute: 0.1 10*3/uL (ref 0.0–0.1)
Basophils Relative: 0.8 % (ref 0.0–3.0)
Eosinophils Absolute: 0.3 10*3/uL (ref 0.0–0.7)
Eosinophils Relative: 3.8 % (ref 0.0–5.0)
HCT: 43.7 % (ref 36.0–46.0)
Hemoglobin: 14.6 g/dL (ref 12.0–15.0)
Lymphocytes Relative: 26.4 % (ref 12.0–46.0)
Lymphs Abs: 2.4 10*3/uL (ref 0.7–4.0)
MCHC: 33.5 g/dL (ref 30.0–36.0)
MCV: 89.8 fl (ref 78.0–100.0)
Monocytes Absolute: 0.7 10*3/uL (ref 0.1–1.0)
Monocytes Relative: 8.1 % (ref 3.0–12.0)
Neutro Abs: 5.4 10*3/uL (ref 1.4–7.7)
Neutrophils Relative %: 60.9 % (ref 43.0–77.0)
Platelets: 373 10*3/uL (ref 150.0–400.0)
RBC: 4.87 Mil/uL (ref 3.87–5.11)
RDW: 13.3 % (ref 11.5–15.5)
WBC: 8.9 10*3/uL (ref 4.0–10.5)

## 2021-01-17 ENCOUNTER — Telehealth: Payer: Self-pay | Admitting: Internal Medicine

## 2021-01-17 ENCOUNTER — Telehealth: Payer: Self-pay | Admitting: Physician Assistant

## 2021-01-17 NOTE — Telephone Encounter (Signed)
Spoke with patient. Pt states she has drops of bright red blood in the toilet bowl and streaks of blood in her stool. She says her stool is not fully formed, she has several narrow pieces of stool. She is having 1-2 urgent bowel movements per day. Pt also reports bloating, gas, and upper stomach pain that feels like a dull ache. She also states she has pelvic pain 5/10 and pressure in her rectum. Patient states that she started Bijuva for her menopause symptoms and states she read online that this can cause colitis. Patient said she stopped taking the Bijuva last night and is going to consult the doctor who prescribed the Bijuva to make sure its alright if she stops it permanently. Patient is scheduled for an appointment with Ellouise Newer tomorrow 01/18/21 at 3 pm. Patient aware of appointment date and time.

## 2021-01-17 NOTE — Telephone Encounter (Signed)
Patient called this morning to see if she could get a sooner appointment.  She has one next week with Ellouise Newer.  She states she has been having rectal bleeding and mucus with every bowel movement since November 1, significant bowel changes, pressure and stomach upset.  She has questions and would like for someone to give her a call.  Thank you.

## 2021-01-18 ENCOUNTER — Ambulatory Visit (INDEPENDENT_AMBULATORY_CARE_PROVIDER_SITE_OTHER): Payer: BC Managed Care – PPO | Admitting: Physician Assistant

## 2021-01-18 ENCOUNTER — Other Ambulatory Visit (INDEPENDENT_AMBULATORY_CARE_PROVIDER_SITE_OTHER): Payer: BC Managed Care – PPO

## 2021-01-18 ENCOUNTER — Encounter: Payer: Self-pay | Admitting: Physician Assistant

## 2021-01-18 VITALS — BP 130/80 | HR 93 | Ht 68.0 in | Wt 149.0 lb

## 2021-01-18 DIAGNOSIS — K30 Functional dyspepsia: Secondary | ICD-10-CM | POA: Diagnosis not present

## 2021-01-18 DIAGNOSIS — R194 Change in bowel habit: Secondary | ICD-10-CM

## 2021-01-18 DIAGNOSIS — K921 Melena: Secondary | ICD-10-CM

## 2021-01-18 DIAGNOSIS — R140 Abdominal distension (gaseous): Secondary | ICD-10-CM

## 2021-01-18 LAB — BASIC METABOLIC PANEL
BUN: 23 mg/dL (ref 6–23)
CO2: 28 mEq/L (ref 19–32)
Calcium: 9.4 mg/dL (ref 8.4–10.5)
Chloride: 101 mEq/L (ref 96–112)
Creatinine, Ser: 0.75 mg/dL (ref 0.40–1.20)
GFR: 91.17 mL/min (ref >60.00)
Glucose, Bld: 90 mg/dL (ref 70–99)
Potassium: 3.9 mEq/L (ref 3.5–5.1)
Sodium: 138 mEq/L (ref 135–145)

## 2021-01-18 NOTE — Patient Instructions (Signed)
You have been scheduled for a CT scan of the abdomen and pelvis at [facility] ([address]---this is in the same building as [employer]).   You are scheduled on Thursday 01/20/21 at 9:30 am. You should arrive 15 minutes prior to your appointment time for registration. Please follow the written instructions below on the day of your exam:  WARNING: IF YOU ARE ALLERGIC TO IODINE/X-RAY DYE, PLEASE NOTIFY RADIOLOGY IMMEDIATELY AT [phone]! YOU WILL BE GIVEN A 13 HOUR PREMEDICATION PREP.  1) Do not eat anything after 5:30 am (4 hours prior to your test) 2) You have been given 2 bottles of oral contrast to drink. The solution may taste better if refrigerated, but do NOT add ice or any other liquid to this solution. Shake well before drinking.    Drink 1 bottle of contrast @ 7:30 am (2 hours prior to your exam)  Drink 1 bottle of contrast @ 8:30 am (1 hour prior to your exam)  You may take any medications as prescribed with a small amount of water, if necessary. If you take any of the following medications: METFORMIN, GLUCOPHAGE, GLUCOVANCE, AVANDAMET, RIOMET, FORTAMET, [place] MET, JANUMET, GLUMETZA or METAGLIP, you MAY be asked to HOLD this medication 48 hours AFTER the exam.  The purpose of you drinking the oral contrast is to aid in the visualization of your intestinal tract. The contrast solution may cause some diarrhea. Depending on your individual set of symptoms, you may also receive an intravenous injection of x-ray contrast/dye. Plan on being at [facility] for 30 minutes or longer, depending on the type of exam you are having performed.  This test typically takes 30-45 minutes to complete.  If you have any questions regarding your exam or if you need to reschedule, you may call the CT department at [phone] between the hours of 8:00 am and 5:00 pm, Monday-Friday.  ____________________________________________________________________  If you are  age 53 or older, your body mass index should be between 23-30. Your Body mass index is 22.66 kg/m. If this is out of the aforementioned range listed, please consider follow up with your Primary Care Provider.  If you are age 49 or younger, your body mass index should be between 19-25. Your Body mass index is 22.66 kg/m. If this is out of the aformentioned range listed, please consider follow up with your Primary Care Provider.   ________________________________________________________  The [facility] providers would like to encourage you to use [facility] to communicate with providers for non-urgent requests or questions.  Due to long hold times on the telephone, sending your provider a message by [facility] may be a faster and more efficient way to get a response.  Please allow 48 business hours for a response.  Please remember that this is for non-urgent requests.  _______________________________________________________

## 2021-01-18 NOTE — Progress Notes (Signed)
Chief Complaint: Change in bowel movements, rectal bleeding  HPI:    Audrey Cox is a 55 year old female with a past medical history as listed below including anxiety, known to Dr. Hilarie Fredrickson, who was referred to me by Leamon Arnt, MD for a complaint of change in bowel habits and rectal bleeding.    06/04/2019 colonoscopy with 1 5 mm polyp in the sigmoid colon otherwise normal.  Pathology showed tubular adenoma and repeat recommended in 7 years.    01/10/2021 patient saw PCP and complained of rectal bleeding with bowel movement since 11/1.  Stools had changed and also feels felt bloated with pelvic pain periodically.  CBC and iron studies normal.    01/17/2021 patient called and asked if she could have a sooner appointment for rectal bleeding and mucus with every bowel movement since November 1.  Discussed that she had recently started Bijuva for her menopause symptoms which can cause ischemic colitis and abdominal cramps/distention.    Today, patient explains that she is very anxious given all the changes in her GI system recently.  Tells me that the first or second week of November she had a little bit of loose stool after they went to [place] over the weekend but attributed this to a change in diet at that time.  But then she started to see some bright red blood when she would wipe after a bowel movement.  Since then she has continued seeing bright red blood with every bowel movement and it seems to have gotten more and more in quantity.  Tells me she will typically have a bowel movement at least once or twice a day but sometimes can skip a day in between.  Along with this has noticed a change in the consistency of her stools telling me that at first she had a large change in color and now that is back to normal, but the consistency is completely different, describes mucus and pieces of stool rather than one formed stool which is very different for her.  Also describes urgency with an occasional episode of  incontinence.  Tells me she feels almost a constant pressure in her tailbone 75% of the day which is definitely worse after having a bowel movement. Also with an increase in gas and lower belly bloating.     Does tell me she started on Bijuva 10/27/2020 for hot flashes and mood swings related to menopause and those symptoms are better, but she began reading about this medicine and saw that it could cause some GI symptoms so she stopped it 2 days ago.      Over the weekend patient also had some indigestion but tells me this is not uncommon for her occasionally and does report having a glass of red wine and pizza on Saturday night.    Does admit to a slight increase in stress/anxiety as her son moved back into the house.    Denies fever, chills, weight loss or symptoms that awaken her from sleep.  Past Medical History:  Diagnosis Date   Allergy    Anxiety    Arthritis    cervical spine and lumbar spine per patient(diagnosed by DC)   Blood transfusion without reported diagnosis    1993 post-op fibroid excision   Family history of malignant melanoma 04/30/2019   Maternal aunt and grandmother. Sees Derm annually   Heart murmur    as a child    History of depression 04/30/2019   Mild; didn't tolerate SSRIs x 2  Past Surgical History:  Procedure Laterality Date   UTERINE FIBROID SURGERY  [phone]   WISDOM TOOTH EXTRACTION      Current Outpatient Medications  Medication Sig Dispense Refill   ALPRAZolam (XANAX) 0.25 MG tablet Take 0.25 mg by mouth as needed.  (Patient not taking: Reported on 01/10/2021)     amphetamine-dextroamphetamine (ADDERALL) 10 MG tablet Take 1 tablet (10 mg total) by mouth daily. (Patient not taking: Reported on 01/10/2021) 30 tablet 0   amphetamine-dextroamphetamine (ADDERALL) 10 MG tablet Take 1 tablet (10 mg total) by mouth daily. 30 tablet 0   fexofenadine (ALLEGRA) 180 MG tablet Take 180 mg by mouth as needed.      ibuprofen (ADVIL) 200 MG tablet Take 200 mg by  mouth as needed.      No current facility-administered medications for this visit.    Allergies as of 01/18/2021 - Review Complete 01/10/2021  Allergen Reaction Noted   Sulfa antibiotics Hives 11/17/2014    Family History  Problem Relation Age of Onset   Depression Mother    Heart disease Mother    Colon polyps Mother    Arthritis Father    Hyperlipidemia Father    Colon polyps Father    Mood Disorder Sister    Arthritis Maternal Grandmother    Heart disease Maternal Grandmother    Hypertension Maternal Grandmother    Melanoma Maternal Grandmother    Heart disease Maternal Grandfather    Arthritis Paternal Grandmother    Colon polyps Paternal Grandmother    Rheum arthritis Paternal Grandfather    Healthy Daughter    Mood Disorder Son    Breast cancer Maternal Aunt    Melanoma Maternal Aunt    Colon cancer Neg Hx    Esophageal cancer Neg Hx    Rectal cancer Neg Hx    Stomach cancer Neg Hx     Social History   Socioeconomic History   Marital status: Married    Spouse name: Not on file   Number of children: 2   Years of education: Not on file   Highest education level: Not on file  Occupational History   Occupation: housewife  Tobacco Use   Smoking status: Never   Smokeless tobacco: Never  Vaping Use   Vaping Use: Never used  Substance and Sexual Activity   Alcohol use: Yes    Alcohol/week: 0.0 standard drinks    Comment: occ wine   Drug use: Never   Sexual activity: Yes    Birth control/protection: Post-menopausal  Other Topics Concern   Not on file  Social History Narrative   Originally from [place]      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      Home Situation:lives with husband and son      Spiritual Beliefs: Christian, but does not attend church       Lifestyle: regular exercise, healthy diet      Social Determinants of Health   Financial Resource Strain: Not on file  Food Insecurity: Not on file  Transportation Needs: Not on file   Physical Activity: Not on file  Stress: Not on file  Social Connections: Not on file  Intimate Partner Violence: Not on file    Review of Systems:    Constitutional: No weight loss, fever or chills Cardiovascular: No chest pain Respiratory: No SOB  Gastrointestinal: See HPI and otherwise negative Genitourinary: No dysuria  Neurological: No headache, dizziness or syncope Musculoskeletal: No new muscle or joint pain Hematologic: No bruising Psychiatric: +  anxiety   Physical Exam:  Vital signs: BP 130/80    Pulse 93    Ht 5\' 8"  (1.727 m)    Wt 149 lb (67.6 kg)    SpO2 97%    BMI 22.66 kg/m   Constitutional:   Pleasant Caucasian female appears to be in NAD, Well developed, Well nourished, alert and cooperative Respiratory: Respirations even and unlabored. Lungs clear to auscultation bilaterally.   No wheezes, crackles, or rhonchi.  Cardiovascular: Normal S1, S2. No MRG. Regular rate and rhythm. No peripheral edema, cyanosis or pallor.  Gastrointestinal:  Soft, nondistended, nontender. No rebound or guarding. Normal bowel sounds. No appreciable masses or hepatomegaly. Rectal: External: Possible very shallow fissure posteriorly, but this was nontender to palpation and did not bleed at time of exam; internal: No mass, tight sphincter tone; anoscopy: No hemorrhoids, no sign of blood Psychiatric: Demonstrates good judgement and reason without abnormal affect or behaviors.  Very anxious  RELEVANT LABS AND IMAGING: CBC    Component Value Date/Time   WBC 8.9 01/10/2021 1640   RBC 4.87 01/10/2021 1640   HGB 14.6 01/10/2021 1640   HCT 43.7 01/10/2021 1640   PLT 373.0 01/10/2021 1640   MCV 89.8 01/10/2021 1640   MCH 30.6 01/13/2020 1012   MCHC 33.5 01/10/2021 1640   RDW 13.3 01/10/2021 1640   LYMPHSABS 2.4 01/10/2021 1640   MONOABS 0.7 01/10/2021 1640   EOSABS 0.3 01/10/2021 1640   BASOSABS 0.1 01/10/2021 1640    CMP     Component Value Date/Time   NA 140 09/08/2020 1032   K 3.9  09/08/2020 1032   CL 101 09/08/2020 1032   CO2 28 09/08/2020 1032   GLUCOSE 89 09/08/2020 1032   BUN 17 09/08/2020 1032   CREATININE 0.80 09/08/2020 1032   CALCIUM 9.9 09/08/2020 1032   PROT 7.8 09/08/2020 1032   ALBUMIN 4.8 09/08/2020 1032   AST 16 09/08/2020 1032   ALT 16 09/08/2020 1032   ALKPHOS 88 09/08/2020 1032   BILITOT 0.6 09/08/2020 1032    Assessment: 1.  Hematochezia: For the past month and a half seems to be getting larger in quantity with each bowel movement, nothing obvious at time of rectal exam/anoscopy today; concern for colitis 2.  Change in bowel habits: Towards parts of stool with mucus and blood over the past month; consider colitis versus IBS versus other 3.  Bloating/gas 4.  Indigestion: Just over the weekend, patient reports similar symptoms here and they are pending on what she eats; likely reactive due to food and stress/anxiety  Plan: 1.  There was nothing on rectal exam that could explain her recent bleeding.  Did discuss that I have a low concern for cancer as this is what the patient is most concerned about.  She did have a colonoscopy only a year and a half ago.  Higher on my list of concerns as a colitis or proctitis. 2.  Scheduled the patient for CT of the abdomen pelvis with contrast.  This was arranged for 01/20/2021, 2 days from now.  Order BMP to ensure kidneys are okay. 3.  Discussed the pending results from above we will either start her on antibiotics if colitis is seen or schedule her for colonoscopy for further evaluation. 4.  Also researched Bijuva, one of the side effects is ischemic colitis (I do not think this applies to her as symptoms do not fit), but can have some abdominal cramping and bloating 5.  Patient to follow in clinic per  recommendations after CT.  Ellouise Newer, PA-C [facility] 01/18/2021, 2:53 PM  Cc: Leamon Arnt, MD

## 2021-01-18 NOTE — Progress Notes (Signed)
Addendum: Reviewed and agree with assessment and management plan. Agree with CT abd and pelvis.  I recommend a very low threshold for repeat colonoscopy if symptoms persist. Jaidence Geisler, Lajuan Lines, MD

## 2021-01-20 ENCOUNTER — Ambulatory Visit (INDEPENDENT_AMBULATORY_CARE_PROVIDER_SITE_OTHER)
Admission: RE | Admit: 2021-01-20 | Discharge: 2021-01-20 | Disposition: A | Discharge status: Home / Self Care | Payer: BC Managed Care – PPO | Source: Ambulatory Visit | Attending: Physician Assistant | Admitting: Physician Assistant

## 2021-01-20 ENCOUNTER — Other Ambulatory Visit: Payer: Self-pay

## 2021-01-20 DIAGNOSIS — R1032 Left lower quadrant pain: Secondary | ICD-10-CM | POA: Diagnosis not present

## 2021-01-20 DIAGNOSIS — R194 Change in bowel habit: Secondary | ICD-10-CM | POA: Diagnosis not present

## 2021-01-20 DIAGNOSIS — R140 Abdominal distension (gaseous): Secondary | ICD-10-CM

## 2021-01-20 DIAGNOSIS — Z86018 Personal history of other benign neoplasm: Secondary | ICD-10-CM | POA: Diagnosis not present

## 2021-01-20 DIAGNOSIS — K921 Melena: Secondary | ICD-10-CM | POA: Diagnosis not present

## 2021-01-20 DIAGNOSIS — K6289 Other specified diseases of anus and rectum: Secondary | ICD-10-CM | POA: Diagnosis not present

## 2021-01-20 MED ORDER — IOHEXOL 300 MG/ML  SOLN
100.0000 mL | Freq: Once | INTRAMUSCULAR | Status: AC | PRN
  Administered 2021-01-20: 100 mL via INTRAVENOUS

## 2021-01-25 ENCOUNTER — Other Ambulatory Visit: Payer: Self-pay

## 2021-01-25 DIAGNOSIS — K6289 Other specified diseases of anus and rectum: Secondary | ICD-10-CM

## 2021-01-25 DIAGNOSIS — R933 Abnormal findings on diagnostic imaging of other parts of digestive tract: Secondary | ICD-10-CM

## 2021-01-25 DIAGNOSIS — R194 Change in bowel habit: Secondary | ICD-10-CM

## 2021-01-25 DIAGNOSIS — K921 Melena: Secondary | ICD-10-CM

## 2021-01-26 ENCOUNTER — Ambulatory Visit: Payer: BC Managed Care – PPO | Admitting: Physician Assistant

## 2021-01-27 ENCOUNTER — Encounter: Payer: Self-pay | Admitting: Internal Medicine

## 2021-01-27 ENCOUNTER — Ambulatory Visit (AMBULATORY_SURGERY_CENTER): Payer: BC Managed Care – PPO | Admitting: Internal Medicine

## 2021-01-27 VITALS — BP 121/86 | HR 85 | Temp 98.0°F | Resp 17 | Ht 68.0 in | Wt 149.0 lb

## 2021-01-27 DIAGNOSIS — K6289 Other specified diseases of anus and rectum: Secondary | ICD-10-CM | POA: Diagnosis not present

## 2021-01-27 DIAGNOSIS — K921 Melena: Secondary | ICD-10-CM

## 2021-01-27 DIAGNOSIS — K625 Hemorrhage of anus and rectum: Secondary | ICD-10-CM | POA: Diagnosis not present

## 2021-01-27 DIAGNOSIS — R935 Abnormal findings on diagnostic imaging of other abdominal regions, including retroperitoneum: Secondary | ICD-10-CM | POA: Diagnosis not present

## 2021-01-27 DIAGNOSIS — K529 Noninfective gastroenteritis and colitis, unspecified: Secondary | ICD-10-CM | POA: Diagnosis not present

## 2021-01-27 MED ORDER — SODIUM CHLORIDE 0.9 % IV SOLN: 500.0000 mL | Freq: Once | INTRAVENOUS | Status: DC

## 2021-01-27 MED ORDER — MESALAMINE 1000 MG RE SUPP
1000.0000 mg | Freq: Every day | RECTAL | 2 refills | Status: DC
Start: 2021-01-27 — End: 2021-05-24

## 2021-01-27 NOTE — Patient Instructions (Signed)
Resume previous medications.  Biopsies taken.  Await results for final recommendations.    Begin Canasa suppositories at bedtime for 8 to 12 weeks.  Office follow up in 8-10 weeks with Dr. Hilarie Fredrickson or APP.  The office will call to schedule.    YOU HAD AN ENDOSCOPIC PROCEDURE TODAY AT [facility]:   Refer to the procedure report that was given to you for any specific questions about what was found during the examination.  If the procedure report does not answer your questions, please call your gastroenterologist to clarify.  If you requested that your care partner not be given the details of your procedure findings, then the procedure report has been included in a sealed envelope for you to review at your convenience later.  YOU SHOULD EXPECT: Some feelings of bloating in the abdomen. Passage of more gas than usual.  Walking can help get rid of the air that was put into your GI tract during the procedure and reduce the bloating. If you had a lower endoscopy (such as a colonoscopy or flexible sigmoidoscopy) you may notice spotting of blood in your stool or on the toilet paper. If you underwent a bowel prep for your procedure, you may not have a normal bowel movement for a few days.  Please Note:  You might notice some irritation and congestion in your nose or some drainage.  This is from the oxygen used during your procedure.  There is no need for concern and it should clear up in a day or so.  SYMPTOMS TO REPORT IMMEDIATELY:  Following lower endoscopy (colonoscopy or flexible sigmoidoscopy):  Excessive amounts of blood in the stool  Significant tenderness or worsening of abdominal pains  Swelling of the abdomen that is new, acute  Fever of 100F or higher  For urgent or emergent issues, a gastroenterologist can be reached at any hour by calling [phone]. Do not use MyChart messaging for urgent concerns.    DIET:  We do recommend a small meal at first, but then you may  proceed to your regular diet.  Drink plenty of fluids but you should avoid alcoholic beverages for 24 hours.  ACTIVITY:  You should plan to take it easy for the rest of today and you should NOT DRIVE or use heavy machinery until tomorrow (because of the sedation medicines used during the test).    FOLLOW UP: Our staff will call the number listed on your records 48-72 hours following your procedure to check on you and address any questions or concerns that you may have regarding the information given to you following your procedure. If we do not reach you, we will leave a message.  We will attempt to reach you two times.  During this call, we will ask if you have developed any symptoms of COVID 19. If you develop any symptoms (ie: fever, flu-like symptoms, shortness of breath, cough etc.) before then, please call [phone].  If you test positive for Covid 19 in the 2 weeks post procedure, please call and report this information to [place].    If any biopsies were taken you will be contacted by phone or by letter within the next 1-3 weeks.  Please call us at [phone] if you have not heard about the biopsies in 3 weeks.    SIGNATURES/CONFIDENTIALITY: You and/or your care partner have signed paperwork which will be entered into your electronic medical record.  These signatures attest to the fact that that the information above  on your After Visit Summary has been reviewed and is understood.  Full responsibility of the confidentiality of this discharge information lies with you and/or your care-partner.

## 2021-01-27 NOTE — Progress Notes (Signed)
Pt's states no medical or surgical changes since previsit or office visit.   EW IV.

## 2021-01-27 NOTE — Progress Notes (Signed)
Called to room to assist during endoscopic procedure.  Patient ID and intended procedure confirmed with present staff. Received instructions for my participation in the procedure from the performing physician.  

## 2021-01-27 NOTE — Progress Notes (Signed)
Patient presents for flexible sigmoidoscopy to evaluate recent rectal bleeding.  CT scan performed showing mild rectal wall thickening possibly related to proctitis.  Seen in the office on 01/18/2021 by Ellouise Newer, PA-C.  See this note for details  She remains appropriate for sedation in the [facility] today.

## 2021-01-27 NOTE — Op Note (Signed)
[facility] Patient Name: Audrey Cox Procedure Date: 01/27/2021 3:27 PM MRN: [id number] Endoscopist: Jerene Bears , MD Age: 55 Referring MD:  Date of Birth: 1965/05/04 Gender: Female Account #: [id number] Procedure:                Flexible Sigmoidoscopy Indications:              Hematochezia with change in bowel movements and                            mucus in stools, Abnormal CT of the GI tract with                            rectal thickening, colonoscopy in April 2021 with                            small adenoma from sigmoid and otherwise normal Medicines:                Monitored Anesthesia Care Procedure:                Pre-Anesthesia Assessment:                           - Prior to the procedure, a History and Physical                            was performed, and patient medications and                            allergies were reviewed. The patient's tolerance of                            previous anesthesia was also reviewed. The risks                            and benefits of the procedure and the sedation                            options and risks were discussed with the patient.                            All questions were answered, and informed consent                            was obtained. Prior Anticoagulants: The patient has                            taken no previous anticoagulant or antiplatelet                            agents. ASA Grade Assessment: II - A patient with                            mild systemic disease. After reviewing the risks  and benefits, the patient was deemed in                            satisfactory condition to undergo the procedure.                           After obtaining informed consent, the scope was                            passed under direct vision. The Olympus PCF-[id number]                            (#[id number]) Colonoscope was introduced through the                            anus  and advanced to the sigmoid colon. The                            flexible sigmoidoscopy was accomplished without                            difficulty. The patient tolerated the procedure                            well. The quality of the bowel preparation was good. Scope In: Scope Out: Findings:                 The digital rectal exam was normal.                           Inflammation was found in a continuous and                            circumferential pattern in the rectum (from the                            dentate line to 12 cm (proximal rectum). This was                            graded as Mayo Score 2 (moderate, with marked                            erythema, absent vascular pattern, friability,                            erosions). Biopsies were taken with a cold forceps                            for histology.                           The rectosigmoid and examined sigmoid colon                            appeared normal. Complications:  No immediate complications. Estimated Blood Loss:     Estimated blood loss was minimal. Impression:               - Moderate inflammation consistent with ulcerative                            proctitis. Biopsied.                           - The rectosigmoid colon and examined sigmoid colon                            is normal. Recommendation:           - Patient has a contact number available for                            emergencies. The signs and symptoms of potential                            delayed complications were discussed with the                            patient. Return to normal activities tomorrow.                            Written discharge instructions were provided to the                            patient.                           - Resume previous diet.                           - Await pathology results.                           - Begin Canasa 1 g qHS x 8-12 weeks.                           - Office  follow-up in 8-10 weeks with me or APP. Jerene Bears, MD 01/27/2021 3:54:01 PM This report has been signed electronically.

## 2021-01-27 NOTE — Progress Notes (Signed)
Report given to PACU, vss 

## 2021-01-28 NOTE — Telephone Encounter (Signed)
I have spoken to patient to advise that although this medication is the best option for her, we can try her on Rowasa enemas or attempt patient assistance for the canasa. Patient indicates that she has already purchased the canasa and would not qualify for patient assistance. She says since she is only taking the medication for a couple of months, she will just pay the money so she can get better.

## 2021-01-28 NOTE — Telephone Encounter (Signed)
This is the best option for the patient's ulcerative proctitis Please see if a specialty pharmacy may be able to get a better price for the patient; are there any [employer] assistance programs for this commercially insured patient? The other option, though less ideal, would be Rowasa enema 4 g nightly retained overnight - same 8-12 weeks

## 2021-01-28 NOTE — Telephone Encounter (Signed)
Dr Hilarie Fredrickson- Please advise... Would you like to change rx? Canasa suppositories are $295.

## 2021-02-01 ENCOUNTER — Telehealth: Payer: Self-pay | Admitting: *Deleted

## 2021-02-01 NOTE — Telephone Encounter (Signed)
°  Follow up Call-  Call back number 01/27/2021 06/04/2019  Post procedure Call Back phone  # [phone]  Permission to leave phone message Yes Yes  Some recent data might be hidden     Patient questions:  Do you have a fever, pain , or abdominal swelling? No. Pain Score  0 *  Have you tolerated food without any problems? Yes.    Have you been able to return to your normal activities? Yes.    Do you have any questions about your discharge instructions: Diet   No. Medications  No. Follow up visit  No.  Do you have questions or concerns about your Care? No.  Actions: * If pain score is 4 or above: No action needed, pain <4.  Have you developed a fever since your procedure? no  2.   Have you had an respiratory symptoms (SOB or cough) since your procedure? no  3.   Have you tested positive for COVID 19 since your procedure no  4.   Have you had any family members/close contacts diagnosed with the COVID 19 since your procedure?  no   If yes to any of these questions please route to Joylene John, RN and Joella Prince, RN

## 2021-02-03 ENCOUNTER — Encounter: Payer: Self-pay | Admitting: *Deleted

## 2021-02-04 ENCOUNTER — Encounter: Payer: Self-pay | Admitting: Internal Medicine

## 2021-02-11 ENCOUNTER — Encounter: Payer: Self-pay | Admitting: Family Medicine

## 2021-03-16 ENCOUNTER — Ambulatory Visit: Payer: BC Managed Care – PPO | Admitting: Family Medicine

## 2021-03-17 ENCOUNTER — Encounter: Payer: Self-pay | Admitting: Internal Medicine

## 2021-03-23 DIAGNOSIS — J3089 Other allergic rhinitis: Secondary | ICD-10-CM | POA: Diagnosis not present

## 2021-03-23 DIAGNOSIS — T781XXD Other adverse food reactions, not elsewhere classified, subsequent encounter: Secondary | ICD-10-CM | POA: Diagnosis not present

## 2021-03-23 DIAGNOSIS — J3081 Allergic rhinitis due to animal (cat) (dog) hair and dander: Secondary | ICD-10-CM | POA: Diagnosis not present

## 2021-03-23 DIAGNOSIS — J301 Allergic rhinitis due to pollen: Secondary | ICD-10-CM | POA: Diagnosis not present

## 2021-03-30 ENCOUNTER — Encounter: Payer: Self-pay | Admitting: Internal Medicine

## 2021-03-30 ENCOUNTER — Other Ambulatory Visit: Payer: BC Managed Care – PPO

## 2021-03-30 ENCOUNTER — Ambulatory Visit (INDEPENDENT_AMBULATORY_CARE_PROVIDER_SITE_OTHER): Payer: BC Managed Care – PPO | Admitting: Internal Medicine

## 2021-03-30 VITALS — BP 118/80 | HR 86 | Ht 68.0 in | Wt 147.2 lb

## 2021-03-30 DIAGNOSIS — R1013 Epigastric pain: Secondary | ICD-10-CM | POA: Diagnosis not present

## 2021-03-30 DIAGNOSIS — K5120 Ulcerative (chronic) proctitis without complications: Secondary | ICD-10-CM | POA: Diagnosis not present

## 2021-03-30 NOTE — Progress Notes (Signed)
Subjective:    Patient ID: Audrey Cox, female    DOB: [birth year], 56 y.o.   MRN: [id number]  HPI Audrey Cox is a 56 yo female with recent diagnosis of ulcerative proctitis, hx of low risk tubular adenoma of the colon who is here for follow-up.  She is here alone today.  She came for flex sig on 01/27/21 to evaluate hematochezia and abnl CT showing rectal wall thickening.  Flexible sigmoidoscopy on 01/27/2021 revealed ulcerative proctitis for the first 10 to 12 cm from the dentate line proximally.  Biopsies confirmed ulcerative proctitis without dysplasia.  She was started on Canasa 1 g nightly.  She took this for 6 weeks and has been off for 2 weeks.  She reports resolution of her rectal bleeding, tenesmus and looser stool.  She is having intermittent indigestion and off-and-on epigastric pain.  She wondered whether the mesalamine suppositories were was causing some part of her dyspeptic symptoms.  She has intermittent bloating and wonders if this is menopausal related.  She was started briefly on Bijuva hormone replacement but stopped this with all that has gone on with her GI tract.  Bowel movements now are daily to every other day.  She does have complete bowel movements without blood or mucus or tenesmus.  She just got back from an all-inclusive vacation in [place].  They had a great trip.   Review of Systems As per HPI, otherwise negative  Current Medications, Allergies, Past Medical History, Past Surgical History, Family History and Social History were reviewed in [employer] record.    Objective:   Physical Exam BP 118/80    Pulse 86    Ht 5\' 8"  (1.727 m)    Wt 147 lb 4 oz (66.8 kg)    BMI 22.39 kg/m  Gen: awake, alert, NAD HEENT: anicteric  Abd: soft, NT/ND, +BS throughout Ext: no c/c/e Neuro: nonfocal  CBC    Component Value Date/Time   WBC 8.9 01/10/2021 1640   RBC 4.87 01/10/2021 1640   HGB 14.6 01/10/2021 1640   HCT 43.7 01/10/2021  1640   PLT 373.0 01/10/2021 1640   MCV 89.8 01/10/2021 1640   MCH 30.6 01/13/2020 1012   MCHC 33.5 01/10/2021 1640   RDW 13.3 01/10/2021 1640   LYMPHSABS 2.4 01/10/2021 1640   MONOABS 0.7 01/10/2021 1640   EOSABS 0.3 01/10/2021 1640   BASOSABS 0.1 01/10/2021 1640   Iron/TIBC/Ferritin/ %Sat    Component Value Date/Time   IRON 84 01/10/2021 1640   TIBC 415.8 01/10/2021 1640   FERRITIN 34.2 01/10/2021 1640   IRONPCTSAT 20.2 01/10/2021 1640   IRONPCTSAT 25 01/13/2020 1012       Assessment & Plan:  56 yo female with recent diagnosis of ulcerative proctitis, hx of low risk tubular adenoma of the colon who is here for follow-up.   Ulcerative proctitis --diagnosis December 2022; confined to the rectum and currently not left-sided colitis.  She has completely responded and reach clinical remission on 6 weeks of Canasa.  She has been off for 2 weeks and doing well.  I am going to leave her off but she understands that this can be a chronic condition needing chronic therapy.  I think time will tell. --Remain off Canasa for now --Fecal calprotectin to assess disease activity  2.  Dyspepsia/epigastric pain --H. pylori stool antigen.  Consider trial of PPI if H. pylori negative.  If this symptom worsens she is asked to notify me.  We will consider upper  endoscopy.  Of note CT scan with contrast performed in December 2022 did not show any pathology in the hepatobiliary tract or pancreas.  30 minutes total spent today including patient facing time, coordination of care, reviewing medical history/procedures/pertinent radiology studies, and documentation of the encounter.

## 2021-03-30 NOTE — Patient Instructions (Signed)
Your provider has requested that you go to the basement level for lab work before leaving today. Press "B" on the elevator. The lab is located at the first door on the left as you exit the elevator.   STAY off Canasa suppositories   If you are age 54 or older, your body mass index should be between 23-30. Your Body mass index is 22.39 kg/m. If this is out of the aforementioned range listed, please consider follow up with your Primary Care Provider.  If you are age 70 or younger, your body mass index should be between 19-25. Your Body mass index is 22.39 kg/m. If this is out of the aformentioned range listed, please consider follow up with your Primary Care Provider.   ________________________________________________________  The [facility] providers would like to encourage you to use [facility] to communicate with providers for non-urgent requests or questions.  Due to long hold times on the telephone, sending your provider a message by [facility] may be a faster and more efficient way to get a response.  Please allow 48 business hours for a response.  Please remember that this is for non-urgent requests.  _______________________________________________________   Due to recent changes in healthcare laws, you may see the results of your imaging and laboratory studies on MyChart before your provider has had a chance to review them.  We understand that in some cases there may be results that are confusing or concerning to you. Not all laboratory results come back in the same time frame and the provider may be waiting for multiple results in order to interpret others.  Please give [place] 48 hours in order for your provider to thoroughly review all the results before contacting the office for clarification of your results.    Thank you for choosing [facility]  Ulice Dash Pyrtle,MD

## 2021-04-05 ENCOUNTER — Other Ambulatory Visit: Payer: BC Managed Care – PPO

## 2021-04-05 DIAGNOSIS — R1013 Epigastric pain: Secondary | ICD-10-CM

## 2021-04-05 DIAGNOSIS — K5120 Ulcerative (chronic) proctitis without complications: Secondary | ICD-10-CM

## 2021-04-06 ENCOUNTER — Encounter: Payer: Self-pay | Admitting: Internal Medicine

## 2021-04-07 LAB — H. PYLORI ANTIGEN, STOOL: H pylori Ag, Stl: NEGATIVE

## 2021-04-10 LAB — CALPROTECTIN, FECAL: Calprotectin, Fecal: 132 ug/g — ABNORMAL HIGH (ref 0–120)

## 2021-04-13 DIAGNOSIS — H40013 Open angle with borderline findings, low risk, bilateral: Secondary | ICD-10-CM | POA: Diagnosis not present

## 2021-04-14 NOTE — Telephone Encounter (Signed)
Please advise patient I got her email ? ?Would plan at least 8 to 12 weeks of Canasa 1 g nightly ?Can add Bentyl 20 mg 3 times daily as needed for abdominal crampy pain ?The test flagged as high was fecal calprotectin which is a marker of inflammation; this is a marker of her ulcerative proctitis activity; if symptoms improve we can follow this as a surrogate for disease activity ? ?

## 2021-04-15 ENCOUNTER — Other Ambulatory Visit: Payer: Self-pay

## 2021-04-15 MED ORDER — DICYCLOMINE HCL 10 MG PO CAPS: 20.0000 mg | ORAL_CAPSULE | Freq: Three times a day (TID) | ORAL | 3 refills | Status: DC | PRN

## 2021-05-23 ENCOUNTER — Encounter: Payer: Self-pay | Admitting: Internal Medicine

## 2021-05-24 ENCOUNTER — Other Ambulatory Visit: Payer: Self-pay | Admitting: Internal Medicine

## 2021-05-24 DIAGNOSIS — K921 Melena: Secondary | ICD-10-CM

## 2021-05-24 DIAGNOSIS — K6289 Other specified diseases of anus and rectum: Secondary | ICD-10-CM

## 2021-05-25 DIAGNOSIS — Z86018 Personal history of other benign neoplasm: Secondary | ICD-10-CM | POA: Diagnosis not present

## 2021-05-25 DIAGNOSIS — L821 Other seborrheic keratosis: Secondary | ICD-10-CM | POA: Diagnosis not present

## 2021-05-25 DIAGNOSIS — L918 Other hypertrophic disorders of the skin: Secondary | ICD-10-CM | POA: Diagnosis not present

## 2021-05-25 DIAGNOSIS — L814 Other melanin hyperpigmentation: Secondary | ICD-10-CM | POA: Diagnosis not present

## 2021-05-25 DIAGNOSIS — D225 Melanocytic nevi of trunk: Secondary | ICD-10-CM | POA: Diagnosis not present

## 2021-06-16 ENCOUNTER — Encounter: Payer: Self-pay | Admitting: Internal Medicine

## 2021-06-17 MED ORDER — PANTOPRAZOLE SODIUM 40 MG PO TBEC: 40.0000 mg | DELAYED_RELEASE_TABLET | Freq: Every day | ORAL | 0 refills | Status: DC

## 2021-06-17 NOTE — Telephone Encounter (Signed)
Please let patient know that I received her email ?I am sorry that she is having the upper symptoms ? ?I would recommend the following: ?If she is willing I would continue the Canasa suppository 1 g nightly ?I would also start pantoprazole 40 mg daily. ?Lets see if this helps her upper abdominal pain and nausea. ?If not better within 2 to 3 weeks I would recommend we perform an upper endoscopy. ? ?Asked her to contact me again in 2 or 3 weeks after trying the pantoprazole and continuing the Canasa at bedtime to let me know how she is feeling and we will really assess symptoms and pursue additional evaluation if she continues to struggle. ?Okay to provide ondansetron 4 mg ODT every 8 hours as needed severe nausea ?

## 2021-08-15 ENCOUNTER — Encounter: Payer: Self-pay | Admitting: Internal Medicine

## 2021-08-16 NOTE — Telephone Encounter (Signed)
Please let patient know that I reviewed her MyChart question. History of ulcerative proctitis and she is now having pressure in the rectum and tailbone despite mesalamine treatment This could be related to proctitis but also could be related to other issues including known uterine fibroids, constipation/incomplete evacuation, or other With her history I think getting objective information is best and recommend flexible sigmoidoscopy in the [place] to assess disease activity and her ulcerative proctitis If she is agreeable this can be scheduled This is part of the evaluation of her symptoms and we can find out exactly the activity of her ulcerative proctitis with this test

## 2021-08-22 ENCOUNTER — Ambulatory Visit (AMBULATORY_SURGERY_CENTER): Payer: Self-pay | Admitting: *Deleted

## 2021-08-22 VITALS — Ht 68.0 in | Wt 147.8 lb

## 2021-08-22 DIAGNOSIS — K5120 Ulcerative (chronic) proctitis without complications: Secondary | ICD-10-CM

## 2021-08-22 NOTE — Progress Notes (Signed)
No egg or soy allergy known to patient  No issues known to pt with past sedation with any surgeries or procedures Patient denies ever being told they had issues or difficulty with intubation  No FH of Malignant Hyperthermia Pt is not on diet pills Pt is not on home 02  Pt is not on blood thinners  Pt denies issues with constipation  No A fib or A flutter Have any cardiac testing pending--NO Pt instructed to use [email or web address] or GoodRx for a price reduction on prep   

## 2021-08-25 ENCOUNTER — Encounter: Payer: Self-pay | Admitting: Internal Medicine

## 2021-08-30 NOTE — Telephone Encounter (Signed)
error 

## 2021-09-01 ENCOUNTER — Encounter: Payer: Self-pay | Admitting: Internal Medicine

## 2021-09-02 ENCOUNTER — Telehealth: Payer: Self-pay | Admitting: Internal Medicine

## 2021-09-02 NOTE — Telephone Encounter (Signed)
Patient has an upcoming sigmoidoscopy.  She forgot to ask if she is still supposed to do her suppository the night before.  Please call patient and advise.  Thank you.

## 2021-09-02 NOTE — Telephone Encounter (Signed)
Patient also said you could respond to her via My Chart if that was easier for you.

## 2021-09-06 ENCOUNTER — Encounter: Payer: Self-pay | Admitting: Internal Medicine

## 2021-09-06 ENCOUNTER — Ambulatory Visit (AMBULATORY_SURGERY_CENTER): Payer: BC Managed Care – PPO | Admitting: Internal Medicine

## 2021-09-06 VITALS — BP 109/72 | HR 86 | Temp 98.2°F | Resp 17 | Ht 68.0 in | Wt 147.8 lb

## 2021-09-06 DIAGNOSIS — K529 Noninfective gastroenteritis and colitis, unspecified: Secondary | ICD-10-CM | POA: Diagnosis not present

## 2021-09-06 DIAGNOSIS — K6289 Other specified diseases of anus and rectum: Secondary | ICD-10-CM | POA: Diagnosis not present

## 2021-09-06 DIAGNOSIS — K5120 Ulcerative (chronic) proctitis without complications: Secondary | ICD-10-CM | POA: Diagnosis not present

## 2021-09-06 MED ORDER — UCERIS 2 MG/ACT RE FOAM: 2.0000 mg | Freq: Two times a day (BID) | RECTAL | 1 refills | Status: AC

## 2021-09-06 MED ORDER — SODIUM CHLORIDE 0.9 % IV SOLN: 500.0000 mL | Freq: Once | INTRAVENOUS | Status: DC

## 2021-09-06 NOTE — Progress Notes (Signed)
To pacu, VSS. Report to RN.tb 

## 2021-09-06 NOTE — Progress Notes (Signed)
GASTROENTEROLOGY PROCEDURE H&P NOTE   Primary Care Physician: Leamon Arnt, MD    Reason for Procedure:  Follow-up ulcerative proctitis  Plan:    Flexible sigmoidoscopy  Patient is appropriate for endoscopic procedure(s) in the ambulatory ([place]) setting.  The nature of the procedure, as well as the risks, benefits, and alternatives were carefully and thoroughly reviewed with the patient. Ample time for discussion and questions allowed. The patient understood, was satisfied, and agreed to proceed.     HPI: Audrey Cox is a 56 y.o. female who presents for flexible sigmoidoscopy.  Medical history as below.  Tolerated the prep.  No recent chest pain or shortness of breath.  No abdominal pain today.  Past Medical History:  Diagnosis Date   Allergy    Anemia    28 years ago   Anxiety    Aortic atherosclerosis (HCC)    Arthritis    cervical spine and lumbar spine per patient(diagnosed by DC)   Blood transfusion without reported diagnosis    1993 post-op fibroid excision   Family history of malignant melanoma 04/30/2019   Maternal aunt and grandmother. Sees Derm annually   Heart murmur    as a child    History of depression 04/30/2019   Mild; didn't tolerate SSRIs x 2   Tubular adenoma of colon    Uterine leiomyoma     Past Surgical History:  Procedure Laterality Date   COLONOSCOPY     UTERINE FIBROID SURGERY  [id number]   WISDOM TOOTH EXTRACTION      Prior to Admission medications   Medication Sig Start Date End Date Taking? Authorizing Provider  Ascorbic Acid (VITAMIN C PO) Take by mouth daily. One pill daily   Yes [provider]  Cholecalciferol (VITAMIN D3 PO) Take by mouth daily. 5000 iu daily   Yes [provider]  fexofenadine (ALLEGRA) 180 MG tablet Take 180 mg by mouth as needed.    Yes [provider]  MAGNESIUM GLYCINATE PO Take 400 mg by mouth daily.   Yes [provider]  mesalamine (CANASA) 1000 MG  suppository INSERT 1 SUPPOSITORY RECTALLY AT BEDTIME 05/24/21  Yes Shirlene Andaya, Lajuan Lines, MD  Omega-3 Fatty Acids (OMEGA-3 FISH OIL PO) Take 1,000 mg by mouth daily.   Yes [provider]  OVER THE COUNTER MEDICATION daily. Juice plus 2 of each,fruit,berry   Yes [provider]  Acetaminophen (TYLENOL EXTRA STRENGTH PO) Take 1 tablet by mouth as needed.    [provider]  dicyclomine (BENTYL) 10 MG capsule Take 2 capsules (20 mg total) by mouth 3 (three) times daily as needed for spasms. Patient not taking: Reported on 08/22/2021 04/15/21   Jerene Bears, MD  metroNIDAZOLE (METROGEL) 1 % gel 1 application Externally Once a day for 30 days Patient not taking: Reported on 08/22/2021 05/25/21 09/22/21  [provider]  pantoprazole (PROTONIX) 40 MG tablet Take 1 tablet (40 mg total) by mouth daily. Patient not taking: Reported on 08/22/2021 06/17/21   Jerene Bears, MD    Current Outpatient Medications  Medication Sig Dispense Refill   Ascorbic Acid (VITAMIN C PO) Take by mouth daily. One pill daily     Cholecalciferol (VITAMIN D3 PO) Take by mouth daily. 5000 iu daily     fexofenadine (ALLEGRA) 180 MG tablet Take 180 mg by mouth as needed.      MAGNESIUM GLYCINATE PO Take 400 mg by mouth daily.     mesalamine (CANASA) 1000 MG suppository  INSERT 1 SUPPOSITORY RECTALLY AT BEDTIME 30 suppository 2   Omega-3 Fatty Acids (OMEGA-3 FISH OIL PO) Take 1,000 mg by mouth daily.     OVER THE COUNTER MEDICATION daily. Juice plus 2 of each,fruit,berry     Acetaminophen (TYLENOL EXTRA STRENGTH PO) Take 1 tablet by mouth as needed.     dicyclomine (BENTYL) 10 MG capsule Take 2 capsules (20 mg total) by mouth 3 (three) times daily as needed for spasms. (Patient not taking: Reported on 08/22/2021) 186 capsule 3   metroNIDAZOLE (METROGEL) 1 % gel 1 application Externally Once a day for 30 days (Patient not taking: Reported on 08/22/2021)     pantoprazole (PROTONIX) 40 MG tablet Take 1 tablet  (40 mg total) by mouth daily. (Patient not taking: Reported on 08/22/2021) 30 tablet 0   Current Facility-Administered Medications  Medication Dose Route Frequency Provider Last Rate Last Admin   0.9 %  sodium chloride infusion  500 mL Intravenous Once Altagracia Rone, Lajuan Lines, MD       0.9 %  sodium chloride infusion  500 mL Intravenous Once Ahamed Hofland, Lajuan Lines, MD        Allergies as of 09/06/2021 - Review Complete 09/06/2021  Allergen Reaction Noted   Bactrim [sulfamethoxazole-trimethoprim]  09/06/2021   Sulfa antibiotics Hives 11/17/2014    Family History  Problem Relation Age of Onset   Depression Mother    Heart disease Mother    Colon polyps Mother    Arthritis Father    Hyperlipidemia Father    Colon polyps Father    Mood Disorder Sister    Breast cancer Maternal Aunt    Melanoma Maternal Aunt    Arthritis Maternal Grandmother    Heart disease Maternal Grandmother    Hypertension Maternal Grandmother    Melanoma Maternal Grandmother    Prostate cancer Maternal Grandfather    Heart disease Maternal Grandfather    Kidney cancer Maternal Grandfather    Arthritis Paternal Grandmother    Colon polyps Paternal Grandmother    Rheum arthritis Paternal Grandfather    Healthy Daughter    Mood Disorder Son    Colon cancer Neg Hx    Esophageal cancer Neg Hx    Rectal cancer Neg Hx    Stomach cancer Neg Hx    Crohn's disease Neg Hx     Social History   Socioeconomic History   Marital status: Married    Spouse name: Not on file   Number of children: 2   Years of education: Not on file   Highest education level: Not on file  Occupational History   Occupation: housewife  Tobacco Use   Smoking status: Never    Passive exposure: Never   Smokeless tobacco: Never  Vaping Use   Vaping Use: Never used  Substance and Sexual Activity   Alcohol use: Not Currently    Comment: occ wine   Drug use: Never   Sexual activity: Yes    Birth control/protection: Post-menopausal  Other Topics  Concern   Not on file  Social History Narrative   Originally from [place]      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      Home Situation:lives with husband and son      Spiritual Beliefs: Christian, but does not attend church       Lifestyle: regular exercise, healthy diet      Social Determinants of Health   Financial Resource Strain: Not on file  Food Insecurity: Not on file  Transportation  Needs: Not on file  Physical Activity: Not on file  Stress: Not on file  Social Connections: Not on file  Intimate Partner Violence: Not on file    Physical Exam: Vital signs in last 24 hours: '@BP'$  (!) 132/92   Pulse 97   Temp 98.2 F (36.8 C)   Resp (!) 7   Ht '5\' 8"'$  (1.727 m)   Wt 147 lb 12.8 oz (67 kg)   SpO2 100%   BMI 22.47 kg/m  GEN: NAD EYE: Sclerae anicteric ENT: MMM CV: Non-tachycardic Pulm: CTA b/l GI: Soft, NT/ND NEURO:  Alert & Oriented x 3   Zenovia Jarred, MD [facility]  09/06/2021 3:02 PM

## 2021-09-06 NOTE — Patient Instructions (Signed)
YOU HAD AN ENDOSCOPIC PROCEDURE TODAY AT THE [facility]:   Refer to the procedure report that was given to you for any specific questions about what was found during the examination.  If the procedure report does not answer your questions, please call your gastroenterologist to clarify.  If you requested that your care partner not be given the details of your procedure findings, then the procedure report has been included in a sealed envelope for you to review at your convenience later.  YOU SHOULD EXPECT: Some feelings of bloating in the abdomen. Passage of more gas than usual.  Walking can help get rid of the air that was put into your GI tract during the procedure and reduce the bloating. If you had a lower endoscopy (such as a colonoscopy or flexible sigmoidoscopy) you may notice spotting of blood in your stool or on the toilet paper. If you underwent a bowel prep for your procedure, you may not have a normal bowel movement for a few days.  Please Note:  You might notice some irritation and congestion in your nose or some drainage.  This is from the oxygen used during your procedure.  There is no need for concern and it should clear up in a day or so.  SYMPTOMS TO REPORT IMMEDIATELY:  Following lower endoscopy (colonoscopy or flexible sigmoidoscopy):  Excessive amounts of blood in the stool  Significant tenderness or worsening of abdominal pains  Swelling of the abdomen that is new, acute  Fever of 100F or higher  Following upper endoscopy (EGD)  Vomiting of blood or coffee ground material  New chest pain or pain under the shoulder blades  Painful or persistently difficult swallowing  New shortness of breath  Fever of 100F or higher  Black, tarry-looking stools  For urgent or emergent issues, a gastroenterologist can be reached at any hour by calling [phone]. Do not use MyChart messaging for urgent concerns.    DIET:  We do recommend a small meal at first, but  then you may proceed to your regular diet.  Drink plenty of fluids but you should avoid alcoholic beverages for 24 hours.  ACTIVITY:  You should plan to take it easy for the rest of today and you should NOT DRIVE or use heavy machinery until tomorrow (because of the sedation medicines used during the test).    FOLLOW UP: Our staff will call the number listed on your records the next business day following your procedure.  We will call around 7:15- 8:00 am to check on you and address any questions or concerns that you may have regarding the information given to you following your procedure. If we do not reach you, we will leave a message.  If you develop any symptoms (ie: fever, flu-like symptoms, shortness of breath, cough etc.) before then, please call [phone].  If you test positive for Covid 19 in the 2 weeks post procedure, please call and report this information to us.    If any biopsies were taken you will be contacted by phone or by letter within the next 1-3 weeks.  Please call us at [phone] if you have not heard about the biopsies in 3 weeks.    SIGNATURES/CONFIDENTIALITY: You and/or your care partner have signed paperwork which will be entered into your electronic medical record.  These signatures attest to the fact that that the information above on your After Visit Summary has been reviewed and is understood.  Full responsibility of the confidentiality   of this discharge information lies with you and/or your care-partner.  

## 2021-09-06 NOTE — Progress Notes (Signed)
Pt's states no medical or surgical changes since previsit or office visit. 

## 2021-09-06 NOTE — Progress Notes (Signed)
Called to room to assist during endoscopic procedure.  Patient ID and intended procedure confirmed with present staff. Received instructions for my participation in the procedure from the performing physician.  

## 2021-09-06 NOTE — Op Note (Signed)
[facility] Patient Name: Audrey Cox Procedure Date: 09/06/2021 2:47 PM MRN: [id number] Endoscopist: Jerene Bears , MD Age: 56 Referring MD:  Date of Birth: 07/19/1965 Gender: Female Account #: [id number] Procedure:                Flexible Sigmoidoscopy Indications:              Personal history of ulcerative proctitis (dx Dec                            2022) follow-up response to therapy with                            intermittent persistent symptoms, current therapy                            Canasa 1 g qHS Medicines:                Monitored Anesthesia Care Procedure:                Pre-Anesthesia Assessment:                           - Prior to the procedure, a History and Physical                            was performed, and patient medications and                            allergies were reviewed. The patient's tolerance of                            previous anesthesia was also reviewed. The risks                            and benefits of the procedure and the sedation                            options and risks were discussed with the patient.                            All questions were answered, and informed consent                            was obtained. Prior Anticoagulants: The patient has                            taken no previous anticoagulant or antiplatelet                            agents. ASA Grade Assessment: II - A patient with                            mild systemic disease. After reviewing the risks  and benefits, the patient was deemed in                            satisfactory condition to undergo the procedure.                           After obtaining informed consent, the scope was                            passed under direct vision. The 0441 PCF-[id number]                            Slim SB Colonoscope was introduced through the anus                            and advanced to the sigmoid colon. The flexible                             sigmoidoscopy was accomplished without difficulty.                            The patient tolerated the procedure well. The                            quality of the bowel preparation was good. Scope In: 3:06:56 PM Scope Out: 3:11:31 PM Total Procedure Duration: 0 hours 4 minutes 35 seconds  Findings:                 The digital rectal exam was normal.                           The proximal rectum and distal sigmoid colon                            appeared normal.                           Localized mild inflammation characterized by                            congestion (edema), erythema, friability and                            shallow ulcerations was found in the rectum (mid                            and distal to about 8 cm). From dentate line to                            mid-rectum. Biopsies were taken with a cold forceps                            for histology.  No additional abnormalities were found on                            retroflexion. Complications:            No immediate complications. Estimated Blood Loss:     Estimated blood loss was minimal. Impression:               - The proximal rectum and distal sigmoid colon are                            normal.                           - Localized mild inflammation was found in the                            rectum secondary to ulcerative proctitis. Improved                            but not resolved with rectal 5-ASA therapy.                            Biopsied. Recommendation:           - Patient has a contact number available for                            emergencies. The signs and symptoms of potential                            delayed complications were discussed with the                            patient. Return to normal activities tomorrow.                            Written discharge instructions were provided to the                            patient.                            - Resume previous diet.                           - Await pathology results.                           - Trial of Uceris foam. Options for escalation of                            therapy include Humira or Entyvio. Jerene Bears, MD 09/06/2021 3:25:42 PM This report has been signed electronically.

## 2021-09-07 ENCOUNTER — Telehealth: Payer: Self-pay | Admitting: *Deleted

## 2021-09-07 NOTE — Telephone Encounter (Signed)
  Follow up Call-     09/06/2021    2:41 PM 01/27/2021    3:00 PM 06/04/2019    8:15 AM  Call back number  Post procedure Call Back phone  # [phone]  Permission to leave phone message Yes Yes Yes     Patient questions:  Do you have a fever, pain , or abdominal swelling? No. Pain Score  0 *  Have you tolerated food without any problems? Yes.    Have you been able to return to your normal activities? Yes.    Do you have any questions about your discharge instructions: Diet   No. Medications  No. Follow up visit  No.  Do you have questions or concerns about your Care? No.  Actions: * If pain score is 4 or above: No action needed, pain <4.

## 2021-09-09 ENCOUNTER — Ambulatory Visit (INDEPENDENT_AMBULATORY_CARE_PROVIDER_SITE_OTHER): Payer: BC Managed Care – PPO | Admitting: Family Medicine

## 2021-09-09 ENCOUNTER — Encounter: Payer: Self-pay | Admitting: Family Medicine

## 2021-09-09 VITALS — BP 122/72 | HR 90 | Temp 98.6°F | Ht 68.0 in | Wt 147.8 lb

## 2021-09-09 DIAGNOSIS — Z0000 Encounter for general adult medical examination without abnormal findings: Secondary | ICD-10-CM | POA: Diagnosis not present

## 2021-09-09 DIAGNOSIS — K51211 Ulcerative (chronic) proctitis with rectal bleeding: Secondary | ICD-10-CM

## 2021-09-09 DIAGNOSIS — D126 Benign neoplasm of colon, unspecified: Secondary | ICD-10-CM | POA: Diagnosis not present

## 2021-09-09 DIAGNOSIS — F988 Other specified behavioral and emotional disorders with onset usually occurring in childhood and adolescence: Secondary | ICD-10-CM

## 2021-09-09 DIAGNOSIS — R87610 Atypical squamous cells of undetermined significance on cytologic smear of cervix (ASC-US): Secondary | ICD-10-CM

## 2021-09-09 DIAGNOSIS — K5120 Ulcerative (chronic) proctitis without complications: Secondary | ICD-10-CM | POA: Insufficient documentation

## 2021-09-09 DIAGNOSIS — F411 Generalized anxiety disorder: Secondary | ICD-10-CM

## 2021-09-09 LAB — CBC WITH DIFFERENTIAL/PLATELET
Basophils Absolute: 0.1 10*3/uL (ref 0.0–0.1)
Basophils Relative: 0.8 % (ref 0.0–3.0)
Eosinophils Absolute: 0.2 10*3/uL (ref 0.0–0.7)
Eosinophils Relative: 2.4 % (ref 0.0–5.0)
HCT: 43 % (ref 36.0–46.0)
Hemoglobin: 14.5 g/dL (ref 12.0–15.0)
Lymphocytes Relative: 22.1 % (ref 12.0–46.0)
Lymphs Abs: 1.8 10*3/uL (ref 0.7–4.0)
MCHC: 33.8 g/dL (ref 30.0–36.0)
MCV: 87.9 fl (ref 78.0–100.0)
Monocytes Absolute: 0.5 10*3/uL (ref 0.1–1.0)
Monocytes Relative: 6.1 % (ref 3.0–12.0)
Neutro Abs: 5.7 10*3/uL (ref 1.4–7.7)
Neutrophils Relative %: 68.6 % (ref 43.0–77.0)
Platelets: 322 10*3/uL (ref 150.0–400.0)
RBC: 4.89 Mil/uL (ref 3.87–5.11)
RDW: 12.8 % (ref 11.5–15.5)
WBC: 8.2 10*3/uL (ref 4.0–10.5)

## 2021-09-09 LAB — LIPID PANEL
Cholesterol: 215 mg/dL — ABNORMAL HIGH (ref 0–200)
HDL: 53.9 mg/dL (ref >39.00)
LDL Cholesterol: 135 mg/dL — ABNORMAL HIGH (ref 0–99)
NonHDL: 161.58
Total CHOL/HDL Ratio: 4
Triglycerides: 135 mg/dL (ref 0.0–149.0)
VLDL: 27 mg/dL (ref 0.0–40.0)

## 2021-09-09 LAB — COMPREHENSIVE METABOLIC PANEL
ALT: 21 U/L (ref 0–35)
AST: 19 U/L (ref 0–37)
Albumin: 4.7 g/dL (ref 3.5–5.2)
Alkaline Phosphatase: 93 U/L (ref 39–117)
BUN: 19 mg/dL (ref 6–23)
CO2: 29 mEq/L (ref 19–32)
Calcium: 9.9 mg/dL (ref 8.4–10.5)
Chloride: 101 mEq/L (ref 96–112)
Creatinine, Ser: 0.68 mg/dL (ref 0.40–1.20)
GFR: 99.29 mL/min (ref >60.00)
Glucose, Bld: 92 mg/dL (ref 70–99)
Potassium: 3.9 mEq/L (ref 3.5–5.1)
Sodium: 139 mEq/L (ref 135–145)
Total Bilirubin: 0.6 mg/dL (ref 0.2–1.2)
Total Protein: 7.7 g/dL (ref 6.0–8.3)

## 2021-09-09 MED ORDER — BUSPIRONE HCL 5 MG PO TABS: 5.0000 mg | ORAL_TABLET | Freq: Two times a day (BID) | ORAL | 2 refills | Status: DC | PRN

## 2021-09-09 NOTE — Progress Notes (Signed)
Subjective  Chief Complaint  Patient presents with   Annual Exam    Pt here for Annual exam and is currently fasting. Mammo is scheduled for Oct/23. Pt stated that she has a new Dx of colitis. Had a sig colonoscopy on 09/06/2021    HPI: Audrey Cox is a 56 y.o. female who presents to [place] [facility] today for a Female Wellness Visit. She also has the concerns and/or needs as listed above in the chief complaint. These will be addressed in addition to the Health Maintenance Visit.   Wellness Visit: annual visit with health maintenance review and exam without Pap  Health maintenance: Screens are current.  Sees GYN in October.  Defers Shingrix vaccination since had complicated course after her first: large tender upper arm where the vaccination was given that lasted approximately a month.  Also had 1 week of myalgias and fatigue. Chronic disease f/u and/or acute problem visit: (deemed necessary to be done in addition to the wellness visit): GI: Diagnosed with ulcerative proctitis back in December.  I reviewed multiple GI notes, procedures including colonoscopies and flexible sigmoidoscopies and lab work.  Initially responded to treatment but then recurred.  Had flexible sigmoidoscopy earlier this week.  Still has active disease.  New medications have been ordered.  She has some discomfort but seems to be managing overall well given the circumstances.  No weight loss.  Eating a very clean diet. History of tubular adenoma by colonoscopy in 2021.  Will be due again in 2028. Upper GI symptoms have resolved.  Did not tolerate Protonix. History of ASCUS with negative HPV on Pap smear: She will follow-up with GYN ADD: Stopped Adderall.  Feels that made her more irritable. Anxiety: We reviewed her long history of anxiety.  She admits this is an underlying chronic problem but she manages overall very well behaviorally.  She has been to counseling in the past but the counselor has  since retired.  She would be interested in restarting that.  She reports she has been on Prozac and Zoloft in the past but did not like how those made her feel.  Sleep is not great either, and may be driven from anxiety.  She would be open to talking about medications but she tends to be quite sensitive to them.  No depressive symptoms.  No panic attacks.  Assessment  1. Annual physical exam   2. Tubular adenoma of colon   3. Chronic ulcerative proctitis with rectal bleeding ([place])   4. Atypical squamous cells of undetermined significance on cytologic smear of cervix (ASC-US)   5. Attention deficit disorder (ADD) without hyperactivity   6. GAD (generalized anxiety disorder)      Plan  Female Wellness Visit: Age appropriate Health Maintenance and Prevention measures were discussed with patient. Included topics are cancer screening recommendations, ways to keep healthy (see AVS) including dietary and exercise recommendations, regular eye and dental care, use of seat belts, and avoidance of moderate alcohol use and tobacco use.  To see GYN in October, mammogram will be due at that time BMI: discussed patient's BMI and encouraged positive lifestyle modifications to help get to or maintain a target BMI. HM needs and immunizations were addressed and ordered. See below for orders. See HM and immunization section for updates. Routine labs and screening tests ordered including cmp, cbc and lipids where appropriate. Discussed recommendations regarding Vit D and calcium supplementation (see AVS)  Chronic disease management visit and/or acute problem visit: Chronic  ulcerative proctitis: Discussed possible etiologies and will follow along with GI for treatment options.  She seems to be tolerating overall well.  Has been a bit difficult though.  Hopefully can get back into remission. Tubular adenoma: For colonoscopy surveillance, due again in 2028 Discussed chronic anxiety, likely generalized anxiety  disorder, mostly behaviorally managed.  Can consider restarting counseling.  Trial of BuSpar discussed and accepted.  Intermittent use expected.  If worsens, can consider SSRI, Lexapro.  Complicated by recent menopausal symptoms.  She does feel like she is coming through these well.  Still with some hot flushes.  However mood and irritability is a bit better.  Monitor sleep. ADD: Possible diagnosis but could be more related to anxiety.  No medications at this time.  Follow up: Return in about 1 year (around 09/10/2022) for complete physical.  Orders Placed This Encounter  Procedures   CBC with Differential/Platelet   Comp Met (CMET)   Lipid Profile   Meds ordered this encounter  Medications   busPIRone (BUSPAR) 5 MG tablet    Sig: Take 1-2 tablets (5-10 mg total) by mouth 2 (two) times daily as needed.    Dispense:  60 tablet    Refill:  2      Body mass index is 22.47 kg/m. Wt Readings from Last 3 Encounters:  09/09/21 147 lb 12.8 oz (67 kg)  09/06/21 147 lb 12.8 oz (67 kg)  08/22/21 147 lb 12.8 oz (67 kg)     Patient Active Problem List   Diagnosis Date Noted   Tubular adenoma of colon 09/09/2021    Priority: High    Colonoscopy 2021. Repeat 7 years, Dr. Hilarie Fredrickson    Chronic ulcerative proctitis ([place]) 09/09/2021   Acne vulgaris 09/08/2020   Attention deficit disorder (ADD) without hyperactivity 08/15/2019   Perimenopausal symptoms 04/30/2019   History of depression 04/30/2019    Mild; didn't tolerate SSRIs x 2    Family history of malignant melanoma 04/30/2019    Maternal aunt and grandmother. Sees Derm annually    Abnormal cervical Papanicolaou smear 02/18/2018   GAD (generalized anxiety disorder) 11/17/2014   Rhinitis, allergic 11/17/2014   Health Maintenance  Topic Date Due   INFLUENZA VACCINE  09/06/2021   COVID-19 Vaccine (3 - Pfizer risk series) 09/25/2021 (Originally 10/23/2019)   MAMMOGRAM  10/07/2021   PAP SMEAR-Modifier  08/20/2022   COLONOSCOPY (Pts  45-83yrs Insurance coverage will need to be confirmed)  06/04/2026   TETANUS/TDAP  04/29/2029   Hepatitis C Screening  Completed   HIV Screening  Completed   HPV VACCINES  Aged Out   Zoster Vaccines- Shingrix  Discontinued   Immunization History  Administered Date(s) Administered   Influenza,inj,Quad PF,6+ Mos 11/17/2014, 12/21/2016, 01/13/2020   PFIZER(Purple Top)SARS-COV-2 Vaccination 09/04/2019, 09/25/2019   Tdap 04/30/2019   Zoster Recombinat (Shingrix) 09/08/2020   We updated and reviewed the patient's past history in detail and it is documented below. Allergies: Patient is allergic to bactrim [sulfamethoxazole-trimethoprim] and sulfa antibiotics. Past Medical History Patient  has a past medical history of Allergy, Anemia, Anxiety, Aortic atherosclerosis ([place]), Arthritis, Blood transfusion without reported diagnosis, Family history of malignant melanoma (04/30/2019), Heart murmur, History of depression (04/30/2019), Tubular adenoma of colon, and Uterine leiomyoma. Past Surgical History Patient  has a past surgical history that includes Uterine fibroid surgery (5621,3086); Wisdom tooth extraction; and Colonoscopy. Family History: Patient family history includes Arthritis in her father, maternal grandmother, and paternal grandmother; Breast cancer in her maternal aunt; Colon polyps in her father,  mother, and paternal grandmother; Depression in her mother; Healthy in her daughter; Heart disease in her maternal grandfather, maternal grandmother, and mother; Hyperlipidemia in her father; Hypertension in her maternal grandmother; Kidney cancer in her maternal grandfather; Melanoma in her maternal aunt and maternal grandmother; Mood Disorder in her sister and son; Prostate cancer in her maternal grandfather; Rheum arthritis in her paternal grandfather. Social History:  Patient  reports that she has never smoked. She has never been exposed to tobacco smoke. She has never used smokeless tobacco.  She reports that she does not currently use alcohol. She reports that she does not use drugs.  Review of Systems: Constitutional: negative for fever or malaise Ophthalmic: negative for photophobia, double vision or loss of vision Cardiovascular: negative for chest pain, dyspnea on exertion, or new LE swelling Respiratory: negative for SOB or persistent cough Gastrointestinal: negative for abdominal pain, change in bowel habits or melena Genitourinary: negative for dysuria or gross hematuria, no abnormal uterine bleeding or disharge Musculoskeletal: negative for new gait disturbance or muscular weakness Integumentary: negative for new or persistent rashes, no breast lumps Neurological: negative for TIA or stroke symptoms Psychiatric: negative for SI or delusions Allergic/Immunologic: negative for hives  Patient Care Team    Relationship Specialty Notifications Start End  Leamon Arnt, MD PCP - General Family Medicine  04/30/19   Molli Posey, MD Consulting Physician Obstetrics and Gynecology  04/30/19   Devra Dopp, MD Referring Physician Dermatology  04/30/19   Jerene Bears, MD Consulting Physician Gastroenterology  09/09/21     Objective  Vitals: BP 122/72   Pulse 90   Temp 98.6 F (37 C)   Ht _0  (1.727 m)   Wt 147 lb 12.8 oz (67 kg)   SpO2 99%   BMI 22.47 kg/m  General:  Well developed, well nourished, no acute distress  Psych:  Alert and orientedx3,normal mood and affect HEENT:  Normocephalic, atraumatic, non-icteric sclera,  supple neck without adenopathy, mass or thyromegaly Cardiovascular:  Normal S1, S2, RRR without gallop, rub or murmur Respiratory:  Good breath sounds bilaterally, CTAB with normal respiratory effort Gastrointestinal: normal bowel sounds, soft, non-tender, no noted masses. No HSM MSK: no deformities, contusions. Joints are without erythema or swelling.  Skin:  Warm, no rashes or suspicious lesions noted Neurologic:    Mental status  is normal. Gross motor and sensory exams are normal. Normal gait. No tremor   Commons side effects, risks, benefits, and alternatives for medications and treatment plan prescribed today were discussed, and the patient expressed understanding of the given instructions. Patient is instructed to call or message via MyChart if he/she has any questions or concerns regarding our treatment plan. No barriers to understanding were identified. We discussed Red Flag symptoms and signs in detail. Patient expressed understanding regarding what to do in case of urgent or emergency type symptoms.  Medication list was reconciled, printed and provided to the patient in AVS. Patient instructions and summary information was reviewed with the patient as documented in the AVS. This note was prepared with assistance of Dragon voice recognition software. Occasional wrong-word or sound-a-like substitutions may have occurred due to the inherent limitations of voice recognition software  This visit occurred during the SARS-CoV-2 public health emergency.  Safety protocols were in place, including screening questions prior to the visit, additional usage of staff PPE, and extensive cleaning of exam room while observing appropriate contact time as indicated for disinfecting solutions.

## 2021-09-09 NOTE — Patient Instructions (Signed)
Please return in 12 months for your annual complete physical; please come fasting. Sooner if needed for anxiety or sleep.  I will release your lab results to you on your MyChart account with further instructions. You may see the results before I do, but when I review them I will send you a message with my report or have my assistant call you if things need to be discussed. Please reply to my message with any questions. Thank you!   You may try the buspar. It may be helpful.  See a counselor again.   If you have any questions or concerns, please don't hesitate to send me a message via MyChart or call the office at [phone]. Thank you for visiting with Audrey Cox today! It's our pleasure caring for you.

## 2021-09-13 ENCOUNTER — Other Ambulatory Visit (HOSPITAL_COMMUNITY): Payer: Self-pay

## 2021-09-14 ENCOUNTER — Telehealth: Payer: Self-pay | Admitting: Family Medicine

## 2021-09-14 ENCOUNTER — Encounter: Payer: Self-pay | Admitting: Internal Medicine

## 2021-09-14 ENCOUNTER — Other Ambulatory Visit: Payer: Self-pay

## 2021-09-14 ENCOUNTER — Other Ambulatory Visit (HOSPITAL_COMMUNITY): Payer: Self-pay

## 2021-09-14 DIAGNOSIS — F411 Generalized anxiety disorder: Secondary | ICD-10-CM

## 2021-09-14 NOTE — Telephone Encounter (Signed)
Patient wants to have a referral for behavior health- patient would like to come in office to see Dr Estanislado Pandy.

## 2021-09-15 ENCOUNTER — Other Ambulatory Visit (HOSPITAL_COMMUNITY): Payer: Self-pay

## 2021-09-15 ENCOUNTER — Telehealth: Payer: Self-pay | Admitting: Pharmacy Technician

## 2021-09-15 NOTE — Telephone Encounter (Signed)
Patient Advocate Encounter  Received notification from [place] that prior authorization for BUDESONE '2MG'$  FOAM is required.   PA submitted on 8.10.23 Key [id number] Status is pending    Luciano Cutter, CPhT Patient Advocate Phone: [phone]

## 2021-09-15 NOTE — Progress Notes (Signed)
PA has been submitted for Budesonide '2mg'$  Foam. Telephone Encounter has been created and will update when update is received.

## 2021-09-19 NOTE — Telephone Encounter (Signed)
Dr Pyrtle- Please see below and advise.... 

## 2021-09-19 NOTE — Telephone Encounter (Signed)
Received a fax regarding Prior Authorization from [facility] for BUDESONIDE FOAM. Authorization has been DENIED because SEE BELOW.

## 2021-09-19 NOTE — Telephone Encounter (Signed)
Monchell,    Her IBD, proctitis is local distal rectum and systemic therapy such as prednisone and ileal/small bowel release budesonide capsule would not treat this disease adequate Is there any other way we can advocate for this medication for her because of the lack of medical appropriateness of the other therapies? Thank you

## 2021-09-20 NOTE — Telephone Encounter (Signed)
Patient called to follow up on status of the Budesonide medication.

## 2021-09-21 NOTE — Telephone Encounter (Signed)
Informed patient that we are continuing to work on getting the medication for her and will be in contact. Patient verbalized understanding.

## 2021-09-26 ENCOUNTER — Encounter: Payer: Self-pay | Admitting: Internal Medicine

## 2021-09-26 ENCOUNTER — Telehealth: Payer: Self-pay

## 2021-09-26 NOTE — Telephone Encounter (Signed)
Ok, I think that is worth a shot and is reasonable plan JMP

## 2021-09-26 NOTE — Telephone Encounter (Signed)
Please make patient aware that her insurance has denied Uceris foam, this is despite appeal Nothing we can do here unfortunately. She has not responded to Canasa 1 g qHS  At this point I would recommend she consider Humira or Entyvio therapy; Humira is a injection every 14 days and Entyvio is an infusion every 8 weeks Both should work to help her further achieve remission My first choice would be Entyvio  Please let me know if she wishes to proceed Continue Canasa  I will write jury letter

## 2021-09-26 NOTE — Telephone Encounter (Signed)
Letter written and forwarded to you

## 2021-09-26 NOTE — Telephone Encounter (Signed)
Foam would be for 6-8 weeks Do you know the out of pocket cost?

## 2021-09-26 NOTE — Telephone Encounter (Signed)
Letter sent to pt via mychart. °

## 2021-09-26 NOTE — Telephone Encounter (Signed)
Pt insurance upheld their decision for denial. Denial letter has been scanned in to chart.

## 2021-09-26 NOTE — Telephone Encounter (Signed)
Pt notified via mychart

## 2021-09-26 NOTE — Telephone Encounter (Signed)
Pt has jury duty this Wednesday and she asked to be excused due to her ulcerative proctitis but they denied her request, they did tell her if she had a doctors note she could be excused. Pt requesting a letter. Her Juror # is [id number].

## 2021-10-18 ENCOUNTER — Encounter: Payer: Self-pay | Admitting: Internal Medicine

## 2021-10-18 ENCOUNTER — Ambulatory Visit (INDEPENDENT_AMBULATORY_CARE_PROVIDER_SITE_OTHER): Payer: BC Managed Care – PPO | Admitting: Internal Medicine

## 2021-10-18 VITALS — BP 126/68 | HR 77 | Ht 68.5 in | Wt 149.0 lb

## 2021-10-18 DIAGNOSIS — K5120 Ulcerative (chronic) proctitis without complications: Secondary | ICD-10-CM | POA: Diagnosis not present

## 2021-10-18 NOTE — Patient Instructions (Signed)
_______________________________________________________  If you are age 54 or older, your body mass index should be between 23-30. Your Body mass index is 22.33 kg/m. If this is out of the aforementioned range listed, please consider follow up with your Primary Care Provider.  If you are age 82 or younger, your body mass index should be between 19-25. Your Body mass index is 22.33 kg/m. If this is out of the aformentioned range listed, please consider follow up with your Primary Care Provider.   ________________________________________________________  The [facility] providers would like to encourage you to use [facility] to communicate with providers for non-urgent requests or questions.  Due to long hold times on the telephone, sending your provider a message by [facility] may be a faster and more efficient way to get a response.  Please allow 48 business hours for a response.  Please remember that this is for non-urgent requests.  _______________________________________________________  Complete Budesonide foam  After that, begin Canasa 1gm every evening  Obtain your Pneumovax 20 and flu shot at your pharmnacy  Please follow up in 3-4 months - email Dr. Hilarie Fredrickson if your symptoms continue before your appointment.

## 2021-10-18 NOTE — Progress Notes (Signed)
   Subjective:    Patient ID: Audrey Cox, female    DOB: 25-Apr-1965, 56 y.o.   MRN: [id number]  HPI Gisela Lea is a 56 year old female with a history of ulcerative proctitis diagnosed in December 2022 and confirmed by repeat flexible sigmoidoscopy on 09/06/2021 with activity despite nightly Canasa 1 g who is here for follow-up.  She is here alone today and was last seen in the office in February and for flex sig in August.  She had persistent symptoms of proctitis despite Canasa and thus flexible sigmoidoscopy was repeated on 09/06/2021.  This showed localized mild inflammation with shallow ulcerations in the mid and distal rectum for approximately 8 cm of distal and mid rectum.  Biopsies confirmed moderately active chronic colitis consistent with IBD.  We started her on budesonide foam which she had to pay out-of-pocket for but she took 1 dose twice daily for 2 weeks and then has been on 1 dose daily for about a week.  Symptoms have dramatically improved.  Her bloating is better, her left-sided abdominal pain is better.  Her bowel movements have returned to near normal and she has had no blood in stool or mucus.  She is very happy with the response.  She does plan to see Dr. Dalbert Batman with [facility] to discuss dietary modifications regarding inflammation.   Review of Systems As per HPI, otherwise negative  Current Medications, Allergies, Past Medical History, Past Surgical History, Family History and Social History were reviewed in [employer] record.     Objective:   Physical Exam BP 126/68   Pulse 77   Ht 5' 8.5" (1.74 m)   Wt 149 lb (67.6 kg)   BMI 22.33 kg/m  Gen: awake, alert, NAD HEENT: anicteric Neuro: nonfocal       Assessment & Plan:  56 year old female with a history of ulcerative proctitis diagnosed in December 2022 and confirmed by repeat flexible sigmoidoscopy on 09/06/2021 with activity despite nightly Canasa 1 g who is here  for follow-up.   Ulcerative proctitis (diagnosis Dec 2022) --she has reached clinical remission with budesonide foam but was unable to do so with 5-ASA therapy alone.  She now understands what "normal" feels like and she is happy with response.  We did discuss that this is a chronic condition we also discussed dietary implications.  We do not have randomized controlled data but a diet that focuses on fresh fruits, vegetables, limited processed foods specifically sugars, and possibly lower gluten can help improve IBD.  We did discuss that diet alone often cannot lead to clinical and histologic remission.  Plan as follows after our discussion: -- Continue the 6 weeks of budesonide rectal foam and then stop -- After completing budesonide resume Canasa suppository 1 g nightly -- If recurrent symptoms then I would recommend we initiate with Entyvio induction and maintenance.  We have discussed Entyvio together today. -- Pneumovax and influenza vaccine is recommended and she will get this from her local pharmacy.  She has already had Shingrix x1.  Follow-up with me in 3 to 4 months, sooner if necessary though I expect to hear from her sooner if symptoms recur once steroids are discontinued and mesalamine resumes.  30 minutes total spent today including patient facing time, coordination of care, reviewing medical history/procedures/pertinent radiology studies, and documentation of the encounter.

## 2021-10-31 ENCOUNTER — Encounter: Payer: Self-pay | Admitting: *Deleted

## 2021-11-06 ENCOUNTER — Encounter: Payer: Self-pay | Admitting: Internal Medicine

## 2021-11-08 ENCOUNTER — Other Ambulatory Visit: Payer: Self-pay

## 2021-11-08 DIAGNOSIS — E8881 Metabolic syndrome: Secondary | ICD-10-CM | POA: Diagnosis not present

## 2021-11-08 DIAGNOSIS — K51818 Other ulcerative colitis with other complication: Secondary | ICD-10-CM | POA: Diagnosis not present

## 2021-11-08 DIAGNOSIS — E559 Vitamin D deficiency, unspecified: Secondary | ICD-10-CM | POA: Diagnosis not present

## 2021-11-08 DIAGNOSIS — E7212 Methylenetetrahydrofolate reductase deficiency: Secondary | ICD-10-CM | POA: Diagnosis not present

## 2021-11-08 DIAGNOSIS — E039 Hypothyroidism, unspecified: Secondary | ICD-10-CM | POA: Diagnosis not present

## 2021-11-08 DIAGNOSIS — E063 Autoimmune thyroiditis: Secondary | ICD-10-CM | POA: Diagnosis not present

## 2021-11-08 DIAGNOSIS — R5383 Other fatigue: Secondary | ICD-10-CM | POA: Diagnosis not present

## 2021-11-08 DIAGNOSIS — E639 Nutritional deficiency, unspecified: Secondary | ICD-10-CM | POA: Diagnosis not present

## 2021-11-08 DIAGNOSIS — E782 Mixed hyperlipidemia: Secondary | ICD-10-CM | POA: Diagnosis not present

## 2021-11-08 MED ORDER — UCERIS 2 MG/ACT RE FOAM: 2.0000 mg | Freq: Every day | RECTAL | 1 refills | Status: DC

## 2021-11-08 MED ORDER — MESALAMINE 1000 MG RE SUPP: 1000.0000 mg | Freq: Every day | RECTAL | 12 refills | Status: DC

## 2021-11-10 DIAGNOSIS — K51818 Other ulcerative colitis with other complication: Secondary | ICD-10-CM | POA: Diagnosis not present

## 2021-11-10 DIAGNOSIS — E639 Nutritional deficiency, unspecified: Secondary | ICD-10-CM | POA: Diagnosis not present

## 2021-11-10 DIAGNOSIS — E279 Disorder of adrenal gland, unspecified: Secondary | ICD-10-CM | POA: Diagnosis not present

## 2021-11-15 LAB — RESULTS CONSOLE HPV: CHL HPV: NEGATIVE

## 2021-11-23 DIAGNOSIS — Z1151 Encounter for screening for human papillomavirus (HPV): Secondary | ICD-10-CM | POA: Diagnosis not present

## 2021-11-23 DIAGNOSIS — Z6822 Body mass index (BMI) 22.0-22.9, adult: Secondary | ICD-10-CM | POA: Diagnosis not present

## 2021-11-23 DIAGNOSIS — Z124 Encounter for screening for malignant neoplasm of cervix: Secondary | ICD-10-CM | POA: Diagnosis not present

## 2021-11-23 DIAGNOSIS — Z1231 Encounter for screening mammogram for malignant neoplasm of breast: Secondary | ICD-10-CM | POA: Diagnosis not present

## 2021-11-23 DIAGNOSIS — Z01419 Encounter for gynecological examination (general) (routine) without abnormal findings: Secondary | ICD-10-CM | POA: Diagnosis not present

## 2021-11-23 LAB — HM MAMMOGRAPHY

## 2021-11-23 LAB — HM PAP SMEAR
HM Pap smear: NEGATIVE
HM Pap smear: NOT DETECTED

## 2021-12-05 ENCOUNTER — Encounter: Payer: Self-pay | Admitting: Internal Medicine

## 2021-12-06 ENCOUNTER — Encounter: Payer: Self-pay | Admitting: Family Medicine

## 2021-12-12 ENCOUNTER — Ambulatory Visit: Payer: BC Managed Care – PPO

## 2021-12-12 DIAGNOSIS — Z719 Counseling, unspecified: Secondary | ICD-10-CM

## 2021-12-19 ENCOUNTER — Ambulatory Visit: Payer: BC Managed Care – PPO

## 2021-12-26 ENCOUNTER — Ambulatory Visit (INDEPENDENT_AMBULATORY_CARE_PROVIDER_SITE_OTHER): Payer: BC Managed Care – PPO

## 2021-12-26 DIAGNOSIS — Z719 Counseling, unspecified: Secondary | ICD-10-CM

## 2021-12-28 DIAGNOSIS — E639 Nutritional deficiency, unspecified: Secondary | ICD-10-CM | POA: Diagnosis not present

## 2021-12-28 DIAGNOSIS — R718 Other abnormality of red blood cells: Secondary | ICD-10-CM | POA: Diagnosis not present

## 2021-12-28 DIAGNOSIS — Z91018 Allergy to other foods: Secondary | ICD-10-CM | POA: Diagnosis not present

## 2021-12-28 DIAGNOSIS — K51818 Other ulcerative colitis with other complication: Secondary | ICD-10-CM | POA: Diagnosis not present

## 2022-01-09 DIAGNOSIS — K51818 Other ulcerative colitis with other complication: Secondary | ICD-10-CM | POA: Diagnosis not present

## 2022-01-09 DIAGNOSIS — R5383 Other fatigue: Secondary | ICD-10-CM | POA: Diagnosis not present

## 2022-01-09 DIAGNOSIS — Z91018 Allergy to other foods: Secondary | ICD-10-CM | POA: Diagnosis not present

## 2022-01-09 DIAGNOSIS — E639 Nutritional deficiency, unspecified: Secondary | ICD-10-CM | POA: Diagnosis not present

## 2022-01-16 ENCOUNTER — Encounter (INDEPENDENT_AMBULATORY_CARE_PROVIDER_SITE_OTHER): Payer: Self-pay

## 2022-01-16 DIAGNOSIS — Z719 Counseling, unspecified: Secondary | ICD-10-CM

## 2022-01-23 ENCOUNTER — Telehealth: Payer: Self-pay | Admitting: Internal Medicine

## 2022-01-23 NOTE — Telephone Encounter (Signed)
Pt states she has proctitis and had last FLex-sig in August. She has been seeing a fundtional MD to try and decrease her inflammation. Last month her fecal calprotectin had gone from 132 to 6. Her pancreatic elastase was 102. Functional MD prescribed creon but states it needs a prior auth. That provider told her if Dr. Norman Herrlich prescribed it insurance might cover it. She states she is still taking mesalamine and canasa suppository at night. She reports she is doing well but wants to make sure it is still ok for her to be on this regimen, she has been doing it for a year. She would like to have a virtual visit to discuss all with Dr. Hilarie Fredrickson. Please advise.

## 2022-01-23 NOTE — Telephone Encounter (Signed)
Certainly okay to prescribe Creon 36,000 units 2 capsules with meals, 1 with snacks for EPI This is okay to use with her oral and rectal mesalamine Please get her a follow-up with me when app spot opens up, okay for virtual visit, nonurgent

## 2022-01-23 NOTE — Telephone Encounter (Signed)
Patient is calling wants to get follow up with Dr Hilarie Fredrickson and send lab results to him and is wishing to speak with the nurse. Please advise

## 2022-01-24 ENCOUNTER — Other Ambulatory Visit (HOSPITAL_COMMUNITY): Payer: Self-pay

## 2022-01-24 MED ORDER — PANCRELIPASE (LIP-PROT-AMYL) 36000-114000 UNITS PO CPEP: ORAL_CAPSULE | ORAL | 11 refills | Status: DC

## 2022-01-24 NOTE — Telephone Encounter (Signed)
Spoke with pt and she is aware of Dr. Quentin Mulling recommendations. Script sent to pharmacy. Pt scheduled for mychart virtual visit 03/16/22 at 8:50am. Pt aware of appt.

## 2022-01-26 ENCOUNTER — Telehealth: Payer: Self-pay | Admitting: Pharmacy Technician

## 2022-01-26 NOTE — Telephone Encounter (Signed)
Patient Advocate Encounter  Received notification from [facility] that prior authorization for CREON 36000 is required.   PA submitted on 12.21.23 Key [id number] Status is pending    Luciano Cutter, CPhT Patient Advocate Phone: [phone]

## 2022-02-01 NOTE — Telephone Encounter (Signed)
Received a fax regarding Prior Authorization from [facility] for [place]. Authorization has been DENIED because .

## 2022-02-02 NOTE — Telephone Encounter (Signed)
Dr Hilarie Fredrickson,  Please see Creon denial. Would you like me to place orders for patient to have fecal elastace? Other suggestions?

## 2022-02-03 NOTE — Telephone Encounter (Signed)
She had fecal elastase with another MD, ask her to submit results or repeat test Thanks JMP

## 2022-02-07 NOTE — Telephone Encounter (Signed)
Dr Hilarie Fredrickson- I was able to locate pancreatic elastase results through [place] and will send this information to our prior authorization team to update insurance on. However, after speaking with the patient, she is concerned about taking Creon due to her recent diagnosis of Alpha Gal (see results of that testing below). She states that her research has shown that Creon is made from pig pancreas. It appears to me that her allergy is more related to beef than pork products, but wanted to get your opinion on this before discussing with patient. Of note, I looked into Zenpep and it is also derived from pig pancreas. Please advise.Marland KitchenMarland KitchenMarland Kitchen

## 2022-02-08 DIAGNOSIS — Z13820 Encounter for screening for osteoporosis: Secondary | ICD-10-CM | POA: Diagnosis not present

## 2022-02-08 NOTE — Telephone Encounter (Signed)
Left detailed message for pt regarding Dr. Vena Rua comments/recommendations. Requested she call back if she has any questions. Pt already has visit scheduled.

## 2022-02-08 NOTE — Telephone Encounter (Signed)
This is a potential concern (Creon and Zenpep are animal derived) and alpha gal causes beef and pork allergies. I would recommend 1st that she avoid all beef and pork and then we can see how her symptoms respond. If not freq diarrhea then I would not feel strongly she needs panc enzymes Also get her OV with me if not already on the books, nonurgent

## 2022-02-13 ENCOUNTER — Encounter (INDEPENDENT_AMBULATORY_CARE_PROVIDER_SITE_OTHER): Payer: Self-pay

## 2022-02-13 DIAGNOSIS — Z719 Counseling, unspecified: Secondary | ICD-10-CM

## 2022-03-03 ENCOUNTER — Encounter: Payer: Self-pay | Admitting: *Deleted

## 2022-03-13 ENCOUNTER — Encounter (INDEPENDENT_AMBULATORY_CARE_PROVIDER_SITE_OTHER): Payer: BC Managed Care – PPO

## 2022-03-13 DIAGNOSIS — J3081 Allergic rhinitis due to animal (cat) (dog) hair and dander: Secondary | ICD-10-CM | POA: Diagnosis not present

## 2022-03-13 DIAGNOSIS — Z719 Counseling, unspecified: Secondary | ICD-10-CM

## 2022-03-13 DIAGNOSIS — J301 Allergic rhinitis due to pollen: Secondary | ICD-10-CM | POA: Diagnosis not present

## 2022-03-13 DIAGNOSIS — T781XXA Other adverse food reactions, not elsewhere classified, initial encounter: Secondary | ICD-10-CM | POA: Diagnosis not present

## 2022-03-13 DIAGNOSIS — J3089 Other allergic rhinitis: Secondary | ICD-10-CM | POA: Diagnosis not present

## 2022-03-16 ENCOUNTER — Telehealth (INDEPENDENT_AMBULATORY_CARE_PROVIDER_SITE_OTHER): Payer: BC Managed Care – PPO | Admitting: Internal Medicine

## 2022-03-16 DIAGNOSIS — Z86010 Personal history of colonic polyps: Secondary | ICD-10-CM

## 2022-03-16 DIAGNOSIS — Z91018 Allergy to other foods: Secondary | ICD-10-CM | POA: Diagnosis not present

## 2022-03-16 DIAGNOSIS — K5120 Ulcerative (chronic) proctitis without complications: Secondary | ICD-10-CM | POA: Diagnosis not present

## 2022-03-16 DIAGNOSIS — K8681 Exocrine pancreatic insufficiency: Secondary | ICD-10-CM

## 2022-03-16 NOTE — Progress Notes (Signed)
Subjective:    Patient ID: Audrey Cox, female    DOB: October 07, 1965, 57 y.o.   MRN: [id number]  This service was provided via telemedicine with audio/visual communication. The patient was located at home The provider was located in provider's GI office. The patient did consent to this telephone visit and is aware of possible charges through their insurance for this visit.   The persons participating in this telemedicine service were the patient and I. Time spent on call: 20 min   HPI Audrey Cox is a 57 year old female with PMH of ulcerative proctitis (dx Dec 2022), alpha-gal allergy, nonadvanced adenomatous colon polyp, low fecal elastase raising the question of mild EPI who is seen for follow-up.  She is seen by MyChart video visit today.  She was last here in September 2023.  She reports that Dr. Dalbert Batman her functional medicine doctor diagnosed her with alpha gal by blood test.  She was having considerable stomach pain and flulike symptoms with achiness and joint pain after eating red meat.  She also had a red meat aversion.  Since this diagnosis she has been strictly avoiding beef and pork and really all mammals on 4 legs.  She is also tried to avoid dairy but not strictly.  Her stomach pain is much improved.  She states the budesonide foam worked very well to get her proctitis in remission.  She was then using Canasa 1 g nightly and has worked this down to every other night.  Dr. Dalbert Batman repeated fecal calprotectin it was normal at 6.  She is having no blood in stool, fecal urgency, mucus or tenesmus symptoms.  She has stopped the Creon because it had mammal ingredients.  Bowel habits are very regular and normal daily.  She states are the best they have been.   Review of Systems As per HPI, otherwise negative  Current Medications, Allergies, Past Medical History, Past Surgical History, Family History and Social History were reviewed in [employer]  record.     Objective:   Physical Exam There were no vitals taken for this visit. Gen: awake, alert, NAD, very well-appearing via video visit  CT ABDOMEN AND PELVIS WITH CONTRAST   TECHNIQUE: Multidetector CT imaging of the abdomen and pelvis was performed using the standard protocol following bolus administration of intravenous contrast.   CONTRAST:  154m OMNIPAQUE IOHEXOL 300 MG/ML  SOLN   COMPARISON:  None.   FINDINGS: Lower chest: No acute abnormality.   Hepatobiliary: No suspicious hepatic lesion. Gallbladder is unremarkable. No biliary ductal dilation   Pancreas: No pancreatic ductal dilation or evidence of acute inflammation.   Spleen: Within normal limits.   Adrenals/Urinary Tract: Adrenal glands are unremarkable. Kidneys are normal, without renal calculi, solid enhancing lesion, or hydronephrosis. Bladder is unremarkable.   Stomach/Bowel: Radiopaque enteric contrast material traverses the rectum. Stomach is predominantly decompressed limiting evaluation. No pathologic dilation of small or large bowel. The appendix and terminal ileum appear normal. Moderate volume of formed stool throughout the colon. Nonspecific mild rectal wall thickening possibly accentuated by under distension for instance on sagittal image 52.   Vascular/Lymphatic: Scattered aortic atherosclerosis without abdominal aortic aneurysm. No pathologically enlarged abdominal or pelvic lymph nodes. There are prominent lymph nodes within the mesorectal fat for instance a 5 mm lymph node on image 72/2 and a 4 mm lymph node on image 71/2.   Reproductive: Multiple enhancing uterine masses measuring up to 4.6 cm, consistent with patient's given history of uterine leiomyomas.  No suspicious adnexal mass.   Other: No significant abdominopelvic free fluid. No discrete omental or peritoneal nodularity.   Musculoskeletal: No acute or significant osseous findings.   IMPRESSION: 1. Nonspecific mild  rectal wall thickening at least in part accentuated by under distension and possibly reflecting proctitis however underlying mass lesion not excluded, correlation with physical exam and possible proctoscopy suggested. 2. Prominent lymph nodes within the mesorectal fat, nonspecific and possibly reactive. 3. Multiple enhancing uterine masses measuring up to 4.6 cm, consistent with patient's given history of uterine leiomyomas. 4. Moderate volume of formed stool throughout the colon. 5.  Aortic Atherosclerosis (ICD10-I70.0).     Electronically Signed   By: Dahlia Bailiff M.D.   On: 01/21/2021 12:02         Assessment & Plan:  57 year old female with PMH of ulcerative proctitis (dx Dec 2022), alpha-gal allergy, nonadvanced adenomatous colon polyp, low fecal elastase raising the question of mild EPI who is seen for follow-up.   Ulcerative proctitis --excellent response to budesonide foam.  She has now been able to reduce Canasa suppository to every other day without return of proctitis symptoms. -- Continue Canasa 1 g nightly to every other night -- Repeat fecal calprotectin in August or September of this year -- Office follow-up with me about 2 to 3 weeks after fecal calprotectin test  2.  Alpha gal allergy --definitive correlation with her upper abdominal symptoms and meat ingestion.  She has an EpiPen -- She is avoiding beef and pork -- EpiPen  3. EPI --low fecal elastase but no symptoms of malabsorption.  Remain off Creon given alpha gal allergy.  She is taking vitamin D and K daily.  Her vitamin A was checked and normal -- Monitor for now, subclinical  4.  History of adenomatous colon polyp --surveillance colonoscopy April 2028  See me in 6 to 9 months  30 minutes total spent today including patient facing time, coordination of care, reviewing medical history/procedures/pertinent radiology studies, and documentation of the encounter.

## 2022-03-16 NOTE — Addendum Note (Signed)
Addended by: Larina Bras on: 03/16/2022 10:16 AM   Modules accepted: Orders

## 2022-03-16 NOTE — Patient Instructions (Addendum)
Remain on Canasa 1 gram every other night.  Your provider has requested that you go to the basement level for lab work in August 2024. Press "B" on the elevator. The lab is located at the first door on the left as you exit the elevator.  Following labwork, you should have an office visit with Dr Hilarie Fredrickson.  _______________________________________________________  The [place] [facility] providers would like to encourage you to use [facility] to communicate with providers for non-urgent requests or questions.  Due to long hold times on the telephone, sending your provider a message by [facility] may be a faster and more efficient way to get a response.  Please allow 48 business hours for a response.  Please remember that this is for non-urgent requests.  _______________________________________________________  Due to recent changes in healthcare laws, you may see the results of your imaging and laboratory studies on MyChart before your provider has had a chance to review them.  We understand that in some cases there may be results that are confusing or concerning to you. Not all laboratory results come back in the same time frame and the provider may be waiting for multiple results in order to interpret others.  Please give [place] 48 hours in order for your provider to thoroughly review all the results before contacting the office for clarification of your results.

## 2022-04-06 DIAGNOSIS — T781XXS Other adverse food reactions, not elsewhere classified, sequela: Secondary | ICD-10-CM | POA: Diagnosis not present

## 2022-04-06 DIAGNOSIS — Z91018 Allergy to other foods: Secondary | ICD-10-CM | POA: Diagnosis not present

## 2022-04-17 ENCOUNTER — Encounter (INDEPENDENT_AMBULATORY_CARE_PROVIDER_SITE_OTHER): Payer: Self-pay

## 2022-04-17 DIAGNOSIS — Z719 Counseling, unspecified: Secondary | ICD-10-CM

## 2022-04-19 ENCOUNTER — Other Ambulatory Visit: Payer: Self-pay

## 2022-04-19 MED ORDER — MESALAMINE 1000 MG RE SUPP: 1000.0000 mg | Freq: Every day | RECTAL | 6 refills | Status: DC

## 2022-04-27 DIAGNOSIS — H40013 Open angle with borderline findings, low risk, bilateral: Secondary | ICD-10-CM | POA: Diagnosis not present

## 2022-04-27 DIAGNOSIS — H1045 Other chronic allergic conjunctivitis: Secondary | ICD-10-CM | POA: Diagnosis not present

## 2022-05-10 ENCOUNTER — Telehealth: Payer: Self-pay | Admitting: Internal Medicine

## 2022-05-10 NOTE — Telephone Encounter (Signed)
Patient called stating that Dr. Hilarie Fredrickson requested for her to have lab work done by August 1st, she is already scheduled to follow up August 7th. Please place lab order and advise.

## 2022-05-12 ENCOUNTER — Other Ambulatory Visit: Payer: Self-pay

## 2022-05-12 NOTE — Telephone Encounter (Signed)
Lab order already in epic, pt aware.

## 2022-05-31 DIAGNOSIS — D225 Melanocytic nevi of trunk: Secondary | ICD-10-CM | POA: Diagnosis not present

## 2022-05-31 DIAGNOSIS — L814 Other melanin hyperpigmentation: Secondary | ICD-10-CM | POA: Diagnosis not present

## 2022-05-31 DIAGNOSIS — L821 Other seborrheic keratosis: Secondary | ICD-10-CM | POA: Diagnosis not present

## 2022-05-31 DIAGNOSIS — Z86018 Personal history of other benign neoplasm: Secondary | ICD-10-CM | POA: Diagnosis not present

## 2022-06-05 DIAGNOSIS — Z719 Counseling, unspecified: Secondary | ICD-10-CM

## 2022-07-04 ENCOUNTER — Encounter: Payer: Self-pay | Admitting: Internal Medicine

## 2022-07-05 ENCOUNTER — Other Ambulatory Visit: Payer: Self-pay

## 2022-07-05 MED ORDER — MESALAMINE 1000 MG RE SUPP: 1000.0000 mg | Freq: Every day | RECTAL | 1 refills | Status: DC

## 2022-07-10 ENCOUNTER — Encounter (INDEPENDENT_AMBULATORY_CARE_PROVIDER_SITE_OTHER): Payer: Self-pay

## 2022-07-10 DIAGNOSIS — Z719 Counseling, unspecified: Secondary | ICD-10-CM

## 2022-08-01 ENCOUNTER — Encounter: Payer: Self-pay | Admitting: Internal Medicine

## 2022-08-23 ENCOUNTER — Other Ambulatory Visit: Payer: Self-pay

## 2022-08-28 ENCOUNTER — Encounter (INDEPENDENT_AMBULATORY_CARE_PROVIDER_SITE_OTHER): Payer: Self-pay

## 2022-08-28 DIAGNOSIS — Z719 Counseling, unspecified: Secondary | ICD-10-CM

## 2022-09-01 ENCOUNTER — Other Ambulatory Visit: Payer: BC Managed Care – PPO

## 2022-09-01 DIAGNOSIS — Z86010 Personal history of colonic polyps: Secondary | ICD-10-CM | POA: Diagnosis not present

## 2022-09-01 DIAGNOSIS — K5120 Ulcerative (chronic) proctitis without complications: Secondary | ICD-10-CM | POA: Diagnosis not present

## 2022-09-01 DIAGNOSIS — K8681 Exocrine pancreatic insufficiency: Secondary | ICD-10-CM

## 2022-09-01 DIAGNOSIS — Z91018 Allergy to other foods: Secondary | ICD-10-CM

## 2022-09-13 ENCOUNTER — Telehealth: Payer: Self-pay

## 2022-09-13 ENCOUNTER — Encounter: Payer: BC Managed Care – PPO | Admitting: Family Medicine

## 2022-09-13 ENCOUNTER — Telehealth: Payer: Self-pay | Admitting: Internal Medicine

## 2022-09-13 ENCOUNTER — Ambulatory Visit (INDEPENDENT_AMBULATORY_CARE_PROVIDER_SITE_OTHER): Payer: BC Managed Care – PPO | Admitting: Family Medicine

## 2022-09-13 ENCOUNTER — Encounter: Payer: Self-pay | Admitting: Internal Medicine

## 2022-09-13 ENCOUNTER — Ambulatory Visit (INDEPENDENT_AMBULATORY_CARE_PROVIDER_SITE_OTHER): Payer: BC Managed Care – PPO | Admitting: Internal Medicine

## 2022-09-13 ENCOUNTER — Encounter: Payer: Self-pay | Admitting: Family Medicine

## 2022-09-13 ENCOUNTER — Other Ambulatory Visit (HOSPITAL_COMMUNITY): Payer: Self-pay

## 2022-09-13 ENCOUNTER — Telehealth: Payer: Self-pay | Admitting: Family Medicine

## 2022-09-13 VITALS — BP 104/70 | HR 96 | Ht 68.0 in | Wt 144.1 lb

## 2022-09-13 VITALS — BP 110/70 | HR 82 | Temp 98.6°F | Ht 68.5 in | Wt 144.6 lb

## 2022-09-13 DIAGNOSIS — K8681 Exocrine pancreatic insufficiency: Secondary | ICD-10-CM | POA: Diagnosis not present

## 2022-09-13 DIAGNOSIS — D126 Benign neoplasm of colon, unspecified: Secondary | ICD-10-CM

## 2022-09-13 DIAGNOSIS — Z0000 Encounter for general adult medical examination without abnormal findings: Secondary | ICD-10-CM | POA: Diagnosis not present

## 2022-09-13 DIAGNOSIS — F988 Other specified behavioral and emotional disorders with onset usually occurring in childhood and adolescence: Secondary | ICD-10-CM

## 2022-09-13 DIAGNOSIS — K51211 Ulcerative (chronic) proctitis with rectal bleeding: Secondary | ICD-10-CM

## 2022-09-13 DIAGNOSIS — E673 Hypervitaminosis D: Secondary | ICD-10-CM | POA: Diagnosis not present

## 2022-09-13 DIAGNOSIS — K5120 Ulcerative (chronic) proctitis without complications: Secondary | ICD-10-CM | POA: Diagnosis not present

## 2022-09-13 DIAGNOSIS — R7989 Other specified abnormal findings of blood chemistry: Secondary | ICD-10-CM

## 2022-09-13 DIAGNOSIS — Z91018 Allergy to other foods: Secondary | ICD-10-CM | POA: Diagnosis not present

## 2022-09-13 DIAGNOSIS — F411 Generalized anxiety disorder: Secondary | ICD-10-CM

## 2022-09-13 LAB — LIPID PANEL
Cholesterol: 213 mg/dL — ABNORMAL HIGH (ref 0–200)
HDL: 64.8 mg/dL (ref >39.00)
LDL Cholesterol: 117 mg/dL — ABNORMAL HIGH (ref 0–99)
NonHDL: 147.94
Total CHOL/HDL Ratio: 3
Triglycerides: 153 mg/dL — ABNORMAL HIGH (ref 0.0–149.0)
VLDL: 30.6 mg/dL (ref 0.0–40.0)

## 2022-09-13 LAB — CBC WITH DIFFERENTIAL/PLATELET
Basophils Absolute: 0 10*3/uL (ref 0.0–0.1)
Basophils Relative: 0.7 % (ref 0.0–3.0)
Eosinophils Absolute: 0.1 10*3/uL (ref 0.0–0.7)
Eosinophils Relative: 1.7 % (ref 0.0–5.0)
HCT: 46.5 % — ABNORMAL HIGH (ref 36.0–46.0)
Hemoglobin: 15.5 g/dL — ABNORMAL HIGH (ref 12.0–15.0)
Lymphocytes Relative: 22.3 % (ref 12.0–46.0)
Lymphs Abs: 1.6 10*3/uL (ref 0.7–4.0)
MCHC: 33.2 g/dL (ref 30.0–36.0)
MCV: 90.6 fl (ref 78.0–100.0)
Monocytes Absolute: 0.4 10*3/uL (ref 0.1–1.0)
Monocytes Relative: 5.4 % (ref 3.0–12.0)
Neutro Abs: 5.1 10*3/uL (ref 1.4–7.7)
Neutrophils Relative %: 69.9 % (ref 43.0–77.0)
Platelets: 337 10*3/uL (ref 150.0–400.0)
RBC: 5.13 Mil/uL — ABNORMAL HIGH (ref 3.87–5.11)
RDW: 13 % (ref 11.5–15.5)
WBC: 7.2 10*3/uL (ref 4.0–10.5)

## 2022-09-13 LAB — COMPREHENSIVE METABOLIC PANEL
ALT: 15 U/L (ref 0–35)
AST: 16 U/L (ref 0–37)
Albumin: 4.7 g/dL (ref 3.5–5.2)
Alkaline Phosphatase: 92 U/L (ref 39–117)
BUN: 17 mg/dL (ref 6–23)
CO2: 31 mEq/L (ref 19–32)
Calcium: 10.3 mg/dL (ref 8.4–10.5)
Chloride: 100 mEq/L (ref 96–112)
Creatinine, Ser: 0.74 mg/dL (ref 0.40–1.20)
GFR: 91.58 mL/min (ref >60.00)
Glucose, Bld: 99 mg/dL (ref 70–99)
Potassium: 4.4 mEq/L (ref 3.5–5.1)
Sodium: 139 mEq/L (ref 135–145)
Total Bilirubin: 0.6 mg/dL (ref 0.2–1.2)
Total Protein: 7.6 g/dL (ref 6.0–8.3)

## 2022-09-13 LAB — VITAMIN D 25 HYDROXY (VIT D DEFICIENCY, FRACTURES): VITD: 110.72 ng/mL (ref 30.00–100.00)

## 2022-09-13 LAB — TSH: TSH: 0.69 u[IU]/mL (ref 0.35–5.50)

## 2022-09-13 MED ORDER — VELSIPITY 2 MG PO TABS: 2.0000 mg | ORAL_TABLET | Freq: Every day | ORAL | 11 refills | Status: DC

## 2022-09-13 NOTE — Telephone Encounter (Signed)
I saw the patient today and when I was writing her clinic note I noticed lab work. I did not notice until after I was on the phone with her that this was ordered by Dr. Mardelle Matte, her PCP. I called her because her vitamin D was elevated She states she has been on a K2/vitamin D3 supplement from alternative medicine for about a year which includes 250 mg of vitamin K 2 and 10,000 IU of vitamin D3 daily. Perhaps her upper abdominal pain is secondary to this supplement and hyper vitamin D and perhaps even hyper vitamin K. I discussed with her that these are fat-soluble and therefore can be toxic at high doses  I recommended that she discontinue the supplement altogether She is does have a history of vitamin D deficiency  I recommended that she remain off vitamin K and vitamin D for 2 to 4 weeks She may need to supplement vitamin D 3 in the future but would do so at around 2000 IU daily  I will also copy Dr. Mardelle Matte on my phone call with the patient  Time provided for questions and answers and she thanked me for the call

## 2022-09-13 NOTE — Telephone Encounter (Signed)
Spoke with Audrey Cox regarding her questions/concerns regarding her physical today and everything has been made to to Audrey Cox that per andy she was just charged for a physical. Audrey Cox understood.

## 2022-09-13 NOTE — Progress Notes (Signed)
Subjective  Chief Complaint  Patient presents with   Annual Exam    HPI: Audrey Cox is a 57 y.o. female who presents to [facility] [place] today for a Female Wellness Visit. She also has the concerns and/or needs as listed above in the chief complaint. These will be addressed in addition to the Health Maintenance Visit.   Wellness Visit: annual visit with health maintenance review and exam without Pap  Health maintenance: Sees GYN, Dr. Marcelle Overlie for female wellness.  Pap and mammogram current.  Next due mammo October.  Colorectal cancer screens are current.  Eye exam up-to-date.  Immunizations up-to-date. Chronic disease f/u and/or acute problem visit: (deemed necessary to be done in addition to the wellness visit): Chronic proctitis being managed by GI.  Stable, slightly improved ADD and anxiety are controlled behaviorally  Assessment  1. Annual physical exam   2. Tubular adenoma of colon   3. Attention deficit disorder (ADD) without hyperactivity   4. Chronic ulcerative proctitis with rectal bleeding (HCC)   5. GAD (generalized anxiety disorder)      Plan  Female Wellness Visit: Age appropriate Health Maintenance and Prevention measures were discussed with patient. Included topics are cancer screening recommendations, ways to keep healthy (see AVS) including dietary and exercise recommendations, regular eye and dental care, use of seat belts, and avoidance of moderate alcohol use and tobacco use.  BMI: discussed patient's BMI and encouraged positive lifestyle modifications to help get to or maintain a target BMI. HM needs and immunizations were addressed and ordered. See below for orders. See HM and immunization section for updates. Routine labs and screening tests ordered including cmp, cbc and lipids where appropriate. Discussed recommendations regarding Vit D and calcium supplementation (see AVS)  Chronic disease management visit and/or acute  problem visit: Follow-up with GI for management of proctitis  mood and ADD are controlled behaviorally  Follow up: Annually for complete physical Orders Placed This Encounter  Procedures   VITAMIN D 25 Hydroxy (Vit-D Deficiency, Fractures)   CBC with Differential/Platelet   Comprehensive metabolic panel   Lipid panel   TSH   No orders of the defined types were placed in this encounter.     Body mass index is 21.67 kg/m. Wt Readings from Last 3 Encounters:  09/13/22 144 lb 9.6 oz (65.6 kg)  10/18/21 149 lb (67.6 kg)  09/09/21 147 lb 12.8 oz (67 kg)     Patient Active Problem List   Diagnosis Date Noted Date Diagnosed   Tubular adenoma of colon 09/09/2021     Priority: High    Colonoscopy 2021. Repeat 7 years, Dr. Rhea Belton    Chronic ulcerative proctitis (HCC) 09/09/2021    Acne vulgaris 09/08/2020    Attention deficit disorder (ADD) without hyperactivity 08/15/2019    Perimenopausal symptoms 04/30/2019    History of depression 04/30/2019     Mild; didn't tolerate SSRIs x 2    Family history of malignant melanoma 04/30/2019     Maternal aunt and grandmother. Sees Derm annually    Abnormal cervical Papanicolaou smear 02/18/2018    GAD (generalized anxiety disorder) 11/17/2014    Rhinitis, allergic 11/17/2014    Health Maintenance  Topic Date Due   COVID-19 Vaccine (3 - Pfizer risk series) 09/29/2022 (Originally 10/23/2019)   INFLUENZA VACCINE  11/11/2022 (Originally 09/07/2022)   MAMMOGRAM  11/24/2022   PAP SMEAR-Modifier  11/23/2024   Colonoscopy  06/04/2026   DTaP/Tdap/Td (2 - Td or Tdap) 04/29/2029  Hepatitis C Screening  Completed   HIV Screening  Completed   HPV VACCINES  Aged Out   Zoster Vaccines- Shingrix  Discontinued   Immunization History  Administered Date(s) Administered   Influenza,inj,Quad PF,6+ Mos 11/17/2014, 12/21/2016, 01/13/2020   Influenza-Unspecified 10/26/2021   PFIZER(Purple Top)SARS-COV-2 Vaccination 09/04/2019, 09/25/2019   Tdap  04/30/2019   Zoster Recombinant(Shingrix) 09/08/2020   We updated and reviewed the patient's past history in detail and it is documented below. Allergies: Patient is allergic to bactrim [sulfamethoxazole-trimethoprim] and sulfa antibiotics. Past Medical History Patient  has a past medical history of Allergy, Allergy to alpha-gal, Anemia, Anxiety, Aortic atherosclerosis (HCC), Arthritis, Blood transfusion without reported diagnosis, Family history of malignant melanoma (04/30/2019), Heart murmur, History of depression (04/30/2019), Pancreatic insufficiency, Tubular adenoma of colon, and Uterine leiomyoma. Past Surgical History Patient  has a past surgical history that includes Uterine fibroid surgery (1610,9604); Wisdom tooth extraction; and Colonoscopy. Family History: Patient family history includes Arthritis in her father, maternal grandmother, and paternal grandmother; Breast cancer in her maternal aunt; Colon polyps in her father, mother, and paternal grandmother; Depression in her mother; Healthy in her daughter; Heart disease in her maternal grandfather, maternal grandmother, and mother; Hyperlipidemia in her father; Hypertension in her maternal grandmother; Kidney cancer in her maternal grandfather; Melanoma in her maternal aunt and maternal grandmother; Mood Disorder in her sister and son; Prostate cancer in her maternal grandfather; Rheum arthritis in her paternal grandfather. Social History:  Patient  reports that she has never smoked. She has never been exposed to tobacco smoke. She has never used smokeless tobacco. She reports that she does not currently use alcohol. She reports that she does not use drugs.  Review of Systems: Constitutional: negative for fever or malaise Ophthalmic: negative for photophobia, double vision or loss of vision Cardiovascular: negative for chest pain, dyspnea on exertion, or new LE swelling Respiratory: negative for SOB or persistent  cough Gastrointestinal: negative for abdominal pain, change in bowel habits or melena Genitourinary: negative for dysuria or gross hematuria, no abnormal uterine bleeding or disharge Musculoskeletal: negative for new gait disturbance or muscular weakness Integumentary: negative for new or persistent rashes, no breast lumps Neurological: negative for TIA or stroke symptoms Psychiatric: negative for SI or delusions Allergic/Immunologic: negative for hives  Patient Care Team    Relationship Specialty Notifications Start End  Willow Ora, MD PCP - General Family Medicine  04/30/19   Richarda Overlie, MD Consulting Physician Obstetrics and Gynecology  04/30/19   Jacqlyn Krauss, MD Referring Physician Dermatology  04/30/19   Beverley Fiedler, MD Consulting Physician Gastroenterology  09/09/21     Objective  Vitals: BP 110/70   Pulse 82   Temp 98.6 F (37 C)   Ht 5' 8.5" (1.74 m)   Wt 144 lb 9.6 oz (65.6 kg)   SpO2 99%   BMI 21.67 kg/m  General:  Well developed, well nourished, no acute distress  Psych:  Alert and orientedx3,normal mood and affect HEENT:  Normocephalic, atraumatic, non-icteric sclera,  supple neck without adenopathy, mass or thyromegaly Cardiovascular:  Normal S1, S2, RRR without gallop, rub or murmur Respiratory:  Good breath sounds bilaterally, CTAB with normal respiratory effort Gastrointestinal: normal bowel sounds, soft, non-tender, no noted masses. No HSM MSK: extremities without edema, joints without erythema or swelling Neurologic:    Mental status is normal.  Gross motor and sensory exams are normal.  No tremor  Commons side effects, risks, benefits, and alternatives for medications and treatment plan prescribed  today were discussed, and the patient expressed understanding of the given instructions. Patient is instructed to call or message via MyChart if he/she has any questions or concerns regarding our treatment plan. No barriers to understanding were  identified. We discussed Red Flag symptoms and signs in detail. Patient expressed understanding regarding what to do in case of urgent or emergency type symptoms.  Medication list was reconciled, printed and provided to the patient in AVS. Patient instructions and summary information was reviewed with the patient as documented in the AVS. This note was prepared with assistance of Dragon voice recognition software. Occasional wrong-word or sound-a-like substitutions may have occurred due to the inherent limitations of voice recognition software

## 2022-09-13 NOTE — Patient Instructions (Addendum)
Continue Canasa 1 gram at bedtime.  We have sent the following medications to your pharmacy for you to pick up at your convenience:Velsipity 1 tablet by mouth daily. This medication will more then likely need a prior authorization. We will work in that and contact you when it is approved or denied.   If your blood pressure at your visit was 140/90 or greater, please contact your primary care physician to follow up on this. ______________________________________________________  If you are age 43 or older, your body mass index should be between 23-30. Your Body mass index is 21.91 kg/m. If this is out of the aforementioned range listed, please consider follow up with your Primary Care Provider.  If you are age 34 or younger, your body mass index should be between 19-25. Your Body mass index is 21.91 kg/m. If this is out of the aformentioned range listed, please consider follow up with your Primary Care Provider.  ________________________________________________________  The [place] [facility] providers would like to encourage you to use [facility] to communicate with providers for non-urgent requests or questions.  Due to long hold times on the telephone, sending your provider a message by [facility] may be a faster and more efficient way to get a response.  Please allow 48 business hours for a response.  Please remember that this is for non-urgent requests.  _______________________________________________________  Due to recent changes in healthcare laws, you may see the results of your imaging and laboratory studies on MyChart before your provider has had a chance to review them.  We understand that in some cases there may be results that are confusing or concerning to you. Not all laboratory results come back in the same time frame and the provider may be waiting for multiple results in order to interpret others.  Please give [place] 48 hours in order for your provider to thoroughly review all the results before  contacting the office for clarification of your results.     Thank you for entrusting me with your care and for choosing [facility], Dr. Erick Blinks

## 2022-09-13 NOTE — Telephone Encounter (Signed)
Critical Vit D: 110.72

## 2022-09-13 NOTE — Telephone Encounter (Signed)
Patient requested to speak with clinical team about recent CPE coding. States she saw something mentioned that she has managed by another provider and didn't want to risk getting charge for the whole CPE based off of that. She can be reached @ [phone]

## 2022-09-13 NOTE — Progress Notes (Signed)
Subjective:    Patient ID: Colvin Caroli, female    DOB: 24-Jun-1965, 57 y.o.   MRN: [id number]  HPI Zalia Buning is a 58 year old female with a history of ulcerative proctitis (dx 2022), alpha gal allergy, history of nonadvanced adenomatous colon polyp, low fecal elastase without clinical symptoms of EPI, anxiety who is here for follow-up.  She is here alone today and was last seen by video visit on 03/16/2022.  She reports that she still has intermittent issues of mild proctitis.  She will see occasionally small specks of blood with bowel movement and have intermittent rectal pain.  The rectal pain and scant bleeding certainly comes and goes.  She is not having mucus in stool.  She is not having diarrhea.  Very rarely if ever of late of her stools been loose.  Symptoms seem to get a little worse with stress.  She tried using the Canasa every other night at bedtime 1 g but with symptoms about 3 weeks ago she increased to nightly.  She recently submitted a fecal calprotectin.  She strictly avoided beef and pork for the last year.  She is also been avoiding dairy but wondering if she can eventually add back dairy products.  She has been recently diagnosed with osteopenia and she is using vitamin D.  Creon was stopped because it had mammalian ingredients; but again no diarrhea.  Some abdominal bloating.  Review of Systems As per HPI, otherwise negative  Current Medications, Allergies, Past Medical History, Past Surgical History, Family History and Social History were reviewed in [employer] record.     Objective:   Physical Exam BP 104/70   Pulse 96   Ht 5\' 8"  (1.727 m)   Wt 144 lb 2 oz (65.4 kg)   BMI 21.91 kg/m  Gen: awake, alert, NAD HEENT: anicteric  Neuro: nonfocal  Fecal calprotectin = 87 (July 2024); 132 (February 2023)  Rectal biopsies last performed 09/06/2021 --moderately active and chronic colitis consistent with IBD (mid and distal rectum only)      Assessment & Plan:   57 year old female with a history of ulcerative proctitis (dx 2022), alpha gal allergy, history of nonadvanced adenomatous colon polyp, low fecal elastase without clinical symptoms of EPI, anxiety who is here for follow-up.   Ulcerative proctitis --she still clinically has symptoms and we discussed how it is quite easy to forget what real normal feels like.  She has some intermittent pain as well as scant bleeding.  She has been using mesalamine 1 g suppository nightly for the last 4 weeks but has been on at least every other day rectal mesalamine for the last year.  Her fecal calprotectin is still not yet normalized.  She has had excellent previous response to budesonide foam, but we certainly do not want to use chronic steroids.  We discussed escalation of therapy today including anti-TNF therapy, Entyvio as well as small molecule specifically with Velsipity.  I do think she would be an excellent candidate for Velsipity and we reviewed this medication as well as possible side effects together today.  We will proceed as follows: -- Continue Canasa 1 g suppository nightly -- Begin Velsipity (will need EKG, eye exam, VZV antibody testing, and lab work which the patient assistance/drug program can arrange); this testing needs to be done before initiating 2 mg daily. -- We may be able to wean off the Canasa if Velsipity is as effective as I hope it will be -- Follow-up with me  in December  2.  Alpha gal allergy --remain completely beef and pork free  3.  EPI --persistently low fecal elastase but no diarrhea symptoms are that of malabsorption.  Her vitamin D level was checked today and is too high and she needs to back off this supplement. -- Subclinical, does not need pancreatic enzyme replacement at this time  4.  Hyper vitamin D --she needs to lower vitamin D dose daily, we will see what dose she is currently taking  5.  History of adenomatous colon polyp --surveillance  colonoscopy April 2028  Follow-up with me in December She will see primary care next week  30 minutes total spent today including patient facing time, coordination of care, reviewing medical history/procedures/pertinent radiology studies, and documentation of the encounter.

## 2022-09-14 ENCOUNTER — Encounter: Payer: Self-pay | Admitting: Family Medicine

## 2022-09-14 ENCOUNTER — Encounter: Payer: Self-pay | Admitting: Internal Medicine

## 2022-09-14 ENCOUNTER — Other Ambulatory Visit: Payer: Self-pay

## 2022-09-14 DIAGNOSIS — R0789 Other chest pain: Secondary | ICD-10-CM

## 2022-09-14 DIAGNOSIS — Z91018 Allergy to other foods: Secondary | ICD-10-CM | POA: Insufficient documentation

## 2022-09-14 NOTE — Progress Notes (Signed)
Please call patient: Please inform patient that lab work results all look stable except her vitamin D level is dangerously high.  Please have her decrease vitamin D supplements to 1000 units daily.  All other labs are stable.

## 2022-09-14 NOTE — Telephone Encounter (Signed)
Please refer patient to cardiology for evaluation --chest tightness. Please make patient aware we are referring her I still think Velsipity will be helpful but we can wait to make sure cardiology evaluation checks out

## 2022-09-14 NOTE — Telephone Encounter (Signed)
Result note sent to notify pt. See there

## 2022-09-18 ENCOUNTER — Other Ambulatory Visit (HOSPITAL_COMMUNITY): Payer: Self-pay

## 2022-09-18 ENCOUNTER — Telehealth: Payer: Self-pay | Admitting: Pharmacy Technician

## 2022-09-18 DIAGNOSIS — F4323 Adjustment disorder with mixed anxiety and depressed mood: Secondary | ICD-10-CM | POA: Diagnosis not present

## 2022-09-18 NOTE — Telephone Encounter (Signed)
Pharmacy Patient Advocate Encounter   Received notification from CoverMyMeds that prior authorization for VELSIPITY 2MG  is required/requested.   Insurance verification completed.   The patient is insured through [facility] .   Per test claim: PA required; PA submitted to [employer] via CoverMyMeds Key/confirmation #/EOC [id number] Status is pending

## 2022-09-19 ENCOUNTER — Other Ambulatory Visit (HOSPITAL_COMMUNITY): Payer: Self-pay

## 2022-09-19 NOTE — Telephone Encounter (Signed)
I am not seeing a record of TB test completed.

## 2022-09-19 NOTE — Telephone Encounter (Signed)
Ok have her come for quant gold Drug company is doing other necessary pre-med testing JMP

## 2022-09-20 ENCOUNTER — Other Ambulatory Visit: Payer: Self-pay

## 2022-09-20 ENCOUNTER — Telehealth: Payer: Self-pay | Admitting: Family Medicine

## 2022-09-20 DIAGNOSIS — T452X4A Poisoning by vitamins, undetermined, initial encounter: Secondary | ICD-10-CM

## 2022-09-20 DIAGNOSIS — K5120 Ulcerative (chronic) proctitis without complications: Secondary | ICD-10-CM

## 2022-09-20 NOTE — Telephone Encounter (Signed)
Patient requests to be called to discuss Lab results

## 2022-09-20 NOTE — Telephone Encounter (Signed)
Spoke with pt regarding lab results/recommendations between her and Dr. Rhea Belton. Pt understood. Will place some future labs to have Vit D rechecked in 8 weeks.

## 2022-09-20 NOTE — Telephone Encounter (Signed)
Order in for quant gold test. Pt knows to come in for lab work.

## 2022-09-25 ENCOUNTER — Encounter: Payer: Self-pay | Admitting: Family Medicine

## 2022-09-27 NOTE — Telephone Encounter (Signed)
Please see pt msg and advise 

## 2022-09-28 NOTE — Telephone Encounter (Signed)
Inbound call from [employer] in regurds to prior auth for velsipity with a call back number at [phone]  F/u on PA.

## 2022-09-29 ENCOUNTER — Other Ambulatory Visit: Payer: BC Managed Care – PPO

## 2022-09-29 DIAGNOSIS — K5120 Ulcerative (chronic) proctitis without complications: Secondary | ICD-10-CM

## 2022-10-01 LAB — QUANTIFERON-TB GOLD PLUS
Mitogen-NIL: >10 [IU]/mL
NIL: 0.01 [IU]/mL
QuantiFERON-TB Gold Plus: NEGATIVE
TB1-NIL: 0 [IU]/mL
TB2-NIL: 0.01 [IU]/mL

## 2022-10-03 DIAGNOSIS — F4323 Adjustment disorder with mixed anxiety and depressed mood: Secondary | ICD-10-CM | POA: Diagnosis not present

## 2022-10-04 NOTE — Telephone Encounter (Signed)
Inbound call from [employer] regarding an update for prior auth for velsipity. Requesting once a outcome is received that it be faxed to [phone]. Please advise, thank you.

## 2022-10-06 ENCOUNTER — Encounter: Payer: Self-pay | Admitting: Internal Medicine

## 2022-10-10 ENCOUNTER — Encounter: Payer: Self-pay | Admitting: Internal Medicine

## 2022-10-10 MED ORDER — MESALAMINE 1000 MG RE SUPP: 1000.0000 mg | Freq: Every day | RECTAL | 2 refills | Status: DC

## 2022-10-10 NOTE — Addendum Note (Signed)
Addended by: Richardson Chiquito on: 10/10/2022 11:44 AM   Modules accepted: Orders

## 2022-10-10 NOTE — Telephone Encounter (Signed)
I called Darl Pikes and left a message for her to give me a call on my direct line. [phone].

## 2022-10-10 NOTE — Telephone Encounter (Signed)
Darl Pikes called from [employer] calling to follow up, to discuss moving forward with appeal.   Phone number [phone]

## 2022-10-10 NOTE — Telephone Encounter (Signed)
Denial Letter for patient's Velsipity was sent to requested fax number [phone].

## 2022-10-16 ENCOUNTER — Encounter (INDEPENDENT_AMBULATORY_CARE_PROVIDER_SITE_OTHER): Payer: Self-pay

## 2022-10-16 DIAGNOSIS — Z719 Counseling, unspecified: Secondary | ICD-10-CM

## 2022-10-17 NOTE — Telephone Encounter (Signed)
Inbound call from Darl Pikes with [employer] requesting a call to discuss if we are going to move forward with an appeal, if so it is only good for 60 days.  Please advise.  Phone number  [phone]

## 2022-10-23 ENCOUNTER — Telehealth: Payer: Self-pay

## 2022-10-23 NOTE — Telephone Encounter (Signed)
Received call from Darl Pikes with [employer]. She is calling wanting to know the status of pts Velsipidy PA. States they can approve medication for the drug but 2 appeals need to be done. You may reach her at [phone].

## 2022-10-23 NOTE — Telephone Encounter (Signed)
Audrey Cox, please let the patient know that I spoke to her insurance and tried to do a peer to peer conversation to get the Velsipity approved I spoke to a [employer] clinical pharmacist They have flatly denied Velsipity They say that she must try and fail (not improved) with 3 of the following drugs: Adalimumab, Stelara, Xeljanz, or Rinvoq Rinvoq would not be able to be approved until adalimumab have been tried and failed  While this is frustrating, we are not going to be able to use Velsipity at this time  We could try Stelara which is a very good medication that I am very familiar with and have used multiple times in the past This is an infusion followed by injection every 8 weeks She could read about this at the medication guide on ccfa.org I would asked that she research this and let me know her thoughts, I am very comfortable with her using it and I do think it would help  Carie Caddy. Tranquilino Fischler, M.D.  10/23/2022

## 2022-10-23 NOTE — Telephone Encounter (Signed)
See note below. Do you want to write an appeal letter?

## 2022-10-24 NOTE — Telephone Encounter (Signed)
Note sent to pt via mychart. Will await further communication from patient how she would like to proceed.

## 2022-10-25 ENCOUNTER — Other Ambulatory Visit: Payer: Self-pay

## 2022-10-25 ENCOUNTER — Other Ambulatory Visit (INDEPENDENT_AMBULATORY_CARE_PROVIDER_SITE_OTHER): Payer: BC Managed Care – PPO

## 2022-10-25 DIAGNOSIS — K5120 Ulcerative (chronic) proctitis without complications: Secondary | ICD-10-CM

## 2022-10-25 DIAGNOSIS — T452X4A Poisoning by vitamins, undetermined, initial encounter: Secondary | ICD-10-CM

## 2022-10-25 LAB — VITAMIN D 25 HYDROXY (VIT D DEFICIENCY, FRACTURES): VITD: 69.19 ng/mL (ref 30.00–100.00)

## 2022-10-25 NOTE — Telephone Encounter (Signed)
Repeat fecal calprotectin No definitive probiotic data; limited data might suggest that VSL #3 1 double strength packet daily can help with proctitis Would be okay if she wanted to try this too

## 2022-10-26 ENCOUNTER — Encounter: Payer: Self-pay | Admitting: Internal Medicine

## 2022-10-26 ENCOUNTER — Other Ambulatory Visit: Payer: Self-pay

## 2022-10-26 MED ORDER — VSL#3 DS PO PACK: 1.0000 | PACK | Freq: Every day | ORAL | 3 refills | Status: AC

## 2022-10-29 NOTE — Progress Notes (Signed)
See mychart note Dear Ms. Norrington, Your vitamin D levels are now in the good range. Please continue your vitamin D at 1000 units daily.  Sincerely, Dr. Mardelle Matte

## 2022-11-01 ENCOUNTER — Other Ambulatory Visit: Payer: BC Managed Care – PPO

## 2022-11-03 NOTE — Telephone Encounter (Signed)
Left message for Audrey Cox that Dr. Rhea Belton is not moving forward with Velsipity for this pt as insurance would not approve if.

## 2022-11-03 NOTE — Telephone Encounter (Signed)
Inbound call from [employer], states they need to know if Dr. Rhea Belton still wishes to continue this patient on Velsipity.

## 2022-11-06 DIAGNOSIS — D259 Leiomyoma of uterus, unspecified: Secondary | ICD-10-CM | POA: Diagnosis not present

## 2022-11-06 DIAGNOSIS — R140 Abdominal distension (gaseous): Secondary | ICD-10-CM | POA: Diagnosis not present

## 2022-11-08 ENCOUNTER — Other Ambulatory Visit: Payer: BC Managed Care – PPO

## 2022-11-08 DIAGNOSIS — K5120 Ulcerative (chronic) proctitis without complications: Secondary | ICD-10-CM

## 2022-11-11 LAB — CALPROTECTIN, FECAL: Calprotectin, Fecal: 83 ug/g (ref 0–120)

## 2022-11-15 ENCOUNTER — Ambulatory Visit: Payer: BC Managed Care – PPO | Admitting: Cardiovascular Disease

## 2022-11-20 ENCOUNTER — Ambulatory Visit: Payer: BC Managed Care – PPO | Admitting: Cardiology

## 2022-11-27 ENCOUNTER — Encounter (INDEPENDENT_AMBULATORY_CARE_PROVIDER_SITE_OTHER): Payer: Self-pay

## 2022-11-27 DIAGNOSIS — Z719 Counseling, unspecified: Secondary | ICD-10-CM

## 2022-11-29 DIAGNOSIS — Z1151 Encounter for screening for human papillomavirus (HPV): Secondary | ICD-10-CM | POA: Diagnosis not present

## 2022-11-29 DIAGNOSIS — Z01419 Encounter for gynecological examination (general) (routine) without abnormal findings: Secondary | ICD-10-CM | POA: Diagnosis not present

## 2022-11-29 DIAGNOSIS — Z6822 Body mass index (BMI) 22.0-22.9, adult: Secondary | ICD-10-CM | POA: Diagnosis not present

## 2022-11-29 DIAGNOSIS — Z124 Encounter for screening for malignant neoplasm of cervix: Secondary | ICD-10-CM | POA: Diagnosis not present

## 2022-11-29 DIAGNOSIS — Z1231 Encounter for screening mammogram for malignant neoplasm of breast: Secondary | ICD-10-CM | POA: Diagnosis not present

## 2022-11-29 LAB — HM MAMMOGRAPHY

## 2022-12-18 ENCOUNTER — Encounter: Payer: Self-pay | Admitting: Internal Medicine

## 2022-12-31 ENCOUNTER — Other Ambulatory Visit: Payer: Self-pay | Admitting: Internal Medicine

## 2023-01-01 ENCOUNTER — Encounter (INDEPENDENT_AMBULATORY_CARE_PROVIDER_SITE_OTHER): Payer: Self-pay

## 2023-01-01 DIAGNOSIS — Z719 Counseling, unspecified: Secondary | ICD-10-CM

## 2023-01-15 ENCOUNTER — Encounter (INDEPENDENT_AMBULATORY_CARE_PROVIDER_SITE_OTHER): Payer: Self-pay

## 2023-01-15 DIAGNOSIS — Z719 Counseling, unspecified: Secondary | ICD-10-CM

## 2023-01-16 ENCOUNTER — Ambulatory Visit (INDEPENDENT_AMBULATORY_CARE_PROVIDER_SITE_OTHER): Payer: BC Managed Care – PPO | Admitting: Internal Medicine

## 2023-01-16 ENCOUNTER — Encounter: Payer: Self-pay | Admitting: Internal Medicine

## 2023-01-16 VITALS — BP 106/64 | HR 90 | Ht 68.0 in | Wt 149.4 lb

## 2023-01-16 DIAGNOSIS — Z91018 Allergy to other foods: Secondary | ICD-10-CM

## 2023-01-16 DIAGNOSIS — R1013 Epigastric pain: Secondary | ICD-10-CM

## 2023-01-16 DIAGNOSIS — K5120 Ulcerative (chronic) proctitis without complications: Secondary | ICD-10-CM | POA: Diagnosis not present

## 2023-01-16 NOTE — Patient Instructions (Addendum)
Continue Canasa Suppositories  Please purchase the following medications over the counter and take as directed: VLS#3 1 double strength packet once daily OR Visbiome 1 double strength packet once daily.    Your provider has requested that you go to the basement level for lab (stool study) the first week in February. Press "B" on the elevator. The lab is located at the first door on the left as you exit the elevator.  You may trial Pepcid AC over the counter to help with Dyspepsia.   _______________________________________________________  If your blood pressure at your visit was 140/90 or greater, please contact your primary care physician to follow up on this.  _______________________________________________________  If you are age 55 or older, your body mass index should be between 23-30. Your Body mass index is 22.71 kg/m. If this is out of the aforementioned range listed, please consider follow up with your Primary Care Provider.  If you are age 69 or younger, your body mass index should be between 19-25. Your Body mass index is 22.71 kg/m. If this is out of the aformentioned range listed, please consider follow up with your Primary Care Provider.   ________________________________________________________  The [facility] providers would like to encourage you to use [facility] to communicate with providers for non-urgent requests or questions.  Due to long hold times on the telephone, sending your provider a message by [facility] may be a faster and more efficient way to get a response.  Please allow 48 business hours for a response.  Please remember that this is for non-urgent requests.  _______________________________________________________  Thank you for choosing me and [facility].  Dr. Vonna Kotyk Pyrtle

## 2023-01-16 NOTE — Progress Notes (Signed)
Patient ID: Audrey Cox, female   DOB: [birth year], 57 y.o.   MRN: [id number] HPI: Discussed the use of AI scribe software for clinical note transcription with the patient, who gave verbal consent to proceed.  History of Present Illness   Audrey Cox, a 57 year old female with a history of ulcerative proctitis diagnosed in 2022, alpha-gal allergy, adenomatous colon polyps, and low fecal elastase without clinical symptoms of EPI, presents for follow-up. The patient was last seen in August 2024, at which time Denese Killings was continued nightly and a prescription for Velsipity was written. However, insurance did not approve Velsipity. A fecal calprotectin test was performed in October 2024, which yielded a borderline result of 83.  The patient reports feeling better since the end of October, attributing this improvement to the initiation of a probiotic (VSL-3). 1 DS packet daily.  She expresses difficulty in obtaining the VSL-3, particularly the double strength version, but believes it has been beneficial in managing her symptoms. The patient takes one packet of VSL-3 double strength around noon daily and continues to use Canasa at bedtime.  Despite the improvement, the patient still experiences frequent stomachaches, particularly after eating certain foods. The discomfort is described as being located high in the abdomen and sometimes feels 'acidy.' The patient manages this with Pepcid AC (famotidine) as needed, which seems to alleviate the discomfort.  The patient also mentions a significant amount of stress related to her adult son's mental health issues. She contracted COVID-19 in May, and a flare-up of her ulcerative proctitis occurred in June, which she speculates may have been triggered by the infection and stress.     She is having no further bleeding, mucus in stool or diarrhea.  No tenesmus.   Past Medical History:  Diagnosis Date   Allergy    Allergy to alpha-gal    Anemia    28 years ago    Anxiety    Aortic atherosclerosis (HCC)    Arthritis    cervical spine and lumbar spine per patient(diagnosed by DC)   Blood transfusion without reported diagnosis    1993 post-op fibroid excision   Family history of malignant melanoma 04/30/2019   Maternal aunt and grandmother. Sees Derm annually   Heart murmur    as a child    History of depression 04/30/2019   Mild; didn't tolerate SSRIs x 2   Pancreatic insufficiency    Tubular adenoma of colon    Uterine leiomyoma     Past Surgical History:  Procedure Laterality Date   COLONOSCOPY     UTERINE FIBROID SURGERY  4098,1191   WISDOM TOOTH EXTRACTION      Outpatient Medications Prior to Visit  Medication Sig Dispense Refill   Ascorbic Acid (VITAMIN C PO) Take by mouth daily. One pill daily     Cholecalciferol (VITAMIN D3 PO) Take by mouth daily. 5000 iu daily     fexofenadine (ALLEGRA) 180 MG tablet Take 180 mg by mouth as needed.      Loteprednol Etabonate (LOTEMAX) 0.5 % OINT Apply 1 Application topically as needed.     mesalamine (CANASA) 1000 MG suppository PLACE 1 SUPPOSITORY (1,000 MG TOTAL) RECTALLY AT BEDTIME. 30 suppository 0   Olopatadine HCl 0.2 % SOLN Place 1 drop into both eyes daily.     Omega-3 Fatty Acids (OMEGA-3 FISH OIL PO) Take 1,000 mg by mouth daily.     Probiotic Product (VSL#3 DS) PACK Take 1 each by mouth daily at 12 noon. 30 each 3  progesterone (PROMETRIUM) 100 MG capsule Take 100 mg by mouth daily.     Etrasimod Arginine (VELSIPITY) 2 MG TABS Take 2 mg by mouth daily. (Patient not taking: Reported on 01/16/2023) 30 tablet 11   No facility-administered medications prior to visit.    Allergies  Allergen Reactions   Alpha-Gal    Bactrim [Sulfamethoxazole-Trimethoprim]    Sulfa Antibiotics Hives    Family History  Problem Relation Age of Onset   Depression Mother    Heart disease Mother    Colon polyps Mother    Arthritis Father    Hyperlipidemia Father    Colon polyps Father     Leukemia Father    Mood Disorder Sister    Arthritis Maternal Grandmother    Heart disease Maternal Grandmother    Hypertension Maternal Grandmother    Melanoma Maternal Grandmother    Prostate cancer Maternal Grandfather    Heart disease Maternal Grandfather    Kidney cancer Maternal Grandfather    Arthritis Paternal Grandmother    Colon polyps Paternal Grandmother    Rheum arthritis Paternal Grandfather    Healthy Daughter    Mood Disorder Son    Breast cancer Maternal Aunt    Melanoma Maternal Aunt    Colon cancer Neg Hx    Esophageal cancer Neg Hx    Rectal cancer Neg Hx    Stomach cancer Neg Hx    Crohn's disease Neg Hx     Social History   Tobacco Use   Smoking status: Never    Passive exposure: Never   Smokeless tobacco: Never  Vaping Use   Vaping status: Never Used  Substance Use Topics   Alcohol use: Not Currently    Comment: occ wine   Drug use: Not Currently    ROS: As per history of present illness, otherwise negative  BP 106/64   Pulse 90   Ht 5\' 8"  (1.727 m)   Wt 149 lb 6 oz (67.8 kg)   SpO2 98%   BMI 22.71 kg/m  Gen: awake, alert, NAD HEENT: anicteric  Ext: no c/c/e Neuro: nonfocal   RELEVANT LABS AND IMAGING: CBC    Component Value Date/Time   WBC 7.2 09/13/2022 0946   RBC 5.13 (H) 09/13/2022 0946   HGB 15.5 (H) 09/13/2022 0946   HCT 46.5 (H) 09/13/2022 0946   PLT 337.0 09/13/2022 0946   MCV 90.6 09/13/2022 0946   MCH 30.6 01/13/2020 1012   MCHC 33.2 09/13/2022 0946   RDW 13.0 09/13/2022 0946   LYMPHSABS 1.6 09/13/2022 0946   MONOABS 0.4 09/13/2022 0946   EOSABS 0.1 09/13/2022 0946   BASOSABS 0.0 09/13/2022 0946    CMP     Component Value Date/Time   NA 139 09/13/2022 0946   K 4.4 09/13/2022 0946   CL 100 09/13/2022 0946   CO2 31 09/13/2022 0946   GLUCOSE 99 09/13/2022 0946   BUN 17 09/13/2022 0946   CREATININE 0.74 09/13/2022 0946   CALCIUM 10.3 09/13/2022 0946   PROT 7.6 09/13/2022 0946   ALBUMIN 4.7 09/13/2022 0946    AST 16 09/13/2022 0946   ALT 15 09/13/2022 0946   ALKPHOS 92 09/13/2022 0946   BILITOT 0.6 09/13/2022 0946    Results   LABS Fecal calprotectin: 83 (11/26/2022); 132 February 2023     ASSESSMENT/PLAN: Assessment and Plan    Ulcerative Proctitis Improvement in symptoms since starting VSL#3 DS and Canasa. Fecal calprotectin borderline at 83. Discussed the benefits of probiotics and the need for consistent  use. -Continue Canasa at bedtime and VSL#3 DS one packet daily.  Substitution with Visbiome same dose is acceptable -Repeat fecal calprotectin in February 2025 (after 56-month commitment to probiotic). -Consider colonoscopy if calprotectin remains borderline or increases. -Other pharmacologic options would be Nicole Cella, Skyrizi  Epigastric Discomfort Occasional epigastric discomfort, possibly related to acid reflux or alpha-gal allergy. Relief with as-needed Pepcid. -Continue Pepcid 20mg  as needed or daily for acid reflux symptoms.  Alpha-Gal Allergy Strict avoidance of beef, pork, and dairy. -Continue avoidance of trigger foods.  General Health Maintenance -Next colonoscopy due in April 2028, may be done earlier depending on calprotectin results and overall disease activity of her proctitis    30 minutes total spent today including patient facing time, coordination of care, reviewing medical history/procedures/pertinent radiology studies, and documentation of the encounter.    WG:NFAO, Malachi Bonds, Md [address] [place],  [place] [address]

## 2023-01-24 ENCOUNTER — Encounter: Payer: Self-pay | Admitting: Internal Medicine

## 2023-02-01 ENCOUNTER — Other Ambulatory Visit: Payer: Self-pay | Admitting: Internal Medicine

## 2023-02-08 ENCOUNTER — Ambulatory Visit: Payer: BC Managed Care – PPO | Admitting: Internal Medicine

## 2023-02-21 DIAGNOSIS — T781XXS Other adverse food reactions, not elsewhere classified, sequela: Secondary | ICD-10-CM | POA: Diagnosis not present

## 2023-02-21 DIAGNOSIS — T781XXA Other adverse food reactions, not elsewhere classified, initial encounter: Secondary | ICD-10-CM | POA: Diagnosis not present

## 2023-02-26 ENCOUNTER — Encounter (INDEPENDENT_AMBULATORY_CARE_PROVIDER_SITE_OTHER): Payer: Self-pay

## 2023-02-26 DIAGNOSIS — Z719 Counseling, unspecified: Secondary | ICD-10-CM

## 2023-03-12 DIAGNOSIS — J3081 Allergic rhinitis due to animal (cat) (dog) hair and dander: Secondary | ICD-10-CM | POA: Diagnosis not present

## 2023-03-12 DIAGNOSIS — H1045 Other chronic allergic conjunctivitis: Secondary | ICD-10-CM | POA: Diagnosis not present

## 2023-03-12 DIAGNOSIS — J3089 Other allergic rhinitis: Secondary | ICD-10-CM | POA: Diagnosis not present

## 2023-03-12 DIAGNOSIS — J301 Allergic rhinitis due to pollen: Secondary | ICD-10-CM | POA: Diagnosis not present

## 2023-04-02 ENCOUNTER — Other Ambulatory Visit: Payer: BC Managed Care – PPO

## 2023-04-02 DIAGNOSIS — R1013 Epigastric pain: Secondary | ICD-10-CM

## 2023-04-02 DIAGNOSIS — K5120 Ulcerative (chronic) proctitis without complications: Secondary | ICD-10-CM | POA: Diagnosis not present

## 2023-04-03 DIAGNOSIS — Z719 Counseling, unspecified: Secondary | ICD-10-CM

## 2023-04-07 LAB — CALPROTECTIN: Calprotectin: <5 ug/g

## 2023-04-09 ENCOUNTER — Encounter: Payer: Self-pay | Admitting: Internal Medicine

## 2023-04-12 ENCOUNTER — Institutional Professional Consult (permissible substitution): Admitting: Surgical

## 2023-04-26 ENCOUNTER — Ambulatory Visit: Admitting: Family Medicine

## 2023-05-08 ENCOUNTER — Institutional Professional Consult (permissible substitution): Payer: Self-pay | Admitting: Plastic Surgery

## 2023-05-21 DIAGNOSIS — Z719 Counseling, unspecified: Secondary | ICD-10-CM

## 2023-06-25 DIAGNOSIS — R5382 Chronic fatigue, unspecified: Secondary | ICD-10-CM | POA: Diagnosis not present

## 2023-06-25 DIAGNOSIS — Z79890 Hormone replacement therapy (postmenopausal): Secondary | ICD-10-CM | POA: Diagnosis not present

## 2023-07-09 ENCOUNTER — Encounter (INDEPENDENT_AMBULATORY_CARE_PROVIDER_SITE_OTHER): Payer: Self-pay

## 2023-07-09 ENCOUNTER — Encounter

## 2023-07-09 DIAGNOSIS — Z719 Counseling, unspecified: Secondary | ICD-10-CM

## 2023-08-01 ENCOUNTER — Other Ambulatory Visit: Payer: Self-pay | Admitting: Internal Medicine

## 2023-09-19 ENCOUNTER — Encounter: Payer: BC Managed Care – PPO | Admitting: Family Medicine

## 2023-10-10 ENCOUNTER — Encounter: Payer: Self-pay | Admitting: Family Medicine

## 2023-10-10 ENCOUNTER — Other Ambulatory Visit: Payer: Self-pay | Admitting: Medical Genetics

## 2023-10-10 ENCOUNTER — Ambulatory Visit: Admitting: Family Medicine

## 2023-10-10 VITALS — BP 122/78 | HR 85 | Temp 97.7°F | Ht 68.0 in | Wt 149.0 lb

## 2023-10-10 DIAGNOSIS — D126 Benign neoplasm of colon, unspecified: Secondary | ICD-10-CM

## 2023-10-10 DIAGNOSIS — F988 Other specified behavioral and emotional disorders with onset usually occurring in childhood and adolescence: Secondary | ICD-10-CM

## 2023-10-10 DIAGNOSIS — Z79890 Hormone replacement therapy (postmenopausal): Secondary | ICD-10-CM

## 2023-10-10 DIAGNOSIS — F411 Generalized anxiety disorder: Secondary | ICD-10-CM | POA: Diagnosis not present

## 2023-10-10 DIAGNOSIS — K51211 Ulcerative (chronic) proctitis with rectal bleeding: Secondary | ICD-10-CM

## 2023-10-10 DIAGNOSIS — Z0000 Encounter for general adult medical examination without abnormal findings: Secondary | ICD-10-CM

## 2023-10-10 DIAGNOSIS — Z0001 Encounter for general adult medical examination with abnormal findings: Secondary | ICD-10-CM

## 2023-10-10 LAB — COMPREHENSIVE METABOLIC PANEL WITH GFR
ALT: 19 U/L (ref 0–35)
AST: 20 U/L (ref 0–37)
Albumin: 4.5 g/dL (ref 3.5–5.2)
Alkaline Phosphatase: 83 U/L (ref 39–117)
BUN: 15 mg/dL (ref 6–23)
CO2: 28 meq/L (ref 19–32)
Calcium: 9.3 mg/dL (ref 8.4–10.5)
Chloride: 102 meq/L (ref 96–112)
Creatinine, Ser: 0.7 mg/dL (ref 0.40–1.20)
GFR: 97.16 mL/min (ref >60.00)
Glucose, Bld: 99 mg/dL (ref 70–99)
Potassium: 4 meq/L (ref 3.5–5.1)
Sodium: 139 meq/L (ref 135–145)
Total Bilirubin: 0.6 mg/dL (ref 0.2–1.2)
Total Protein: 7.6 g/dL (ref 6.0–8.3)

## 2023-10-10 LAB — CBC WITH DIFFERENTIAL/PLATELET
Basophils Absolute: 0 K/uL (ref 0.0–0.1)
Basophils Relative: 0.6 % (ref 0.0–3.0)
Eosinophils Absolute: 0.1 K/uL (ref 0.0–0.7)
Eosinophils Relative: 1.7 % (ref 0.0–5.0)
HCT: 45.2 % (ref 36.0–46.0)
Hemoglobin: 15.3 g/dL — ABNORMAL HIGH (ref 12.0–15.0)
Lymphocytes Relative: 22.2 % (ref 12.0–46.0)
Lymphs Abs: 1.7 K/uL (ref 0.7–4.0)
MCHC: 33.8 g/dL (ref 30.0–36.0)
MCV: 89.1 fl (ref 78.0–100.0)
Monocytes Absolute: 0.5 K/uL (ref 0.1–1.0)
Monocytes Relative: 6 % (ref 3.0–12.0)
Neutro Abs: 5.3 K/uL (ref 1.4–7.7)
Neutrophils Relative %: 69.5 % (ref 43.0–77.0)
Platelets: 328 K/uL (ref 150.0–400.0)
RBC: 5.07 Mil/uL (ref 3.87–5.11)
RDW: 12.9 % (ref 11.5–15.5)
WBC: 7.6 K/uL (ref 4.0–10.5)

## 2023-10-10 LAB — LIPID PANEL
Cholesterol: 204 mg/dL — ABNORMAL HIGH (ref 0–200)
HDL: 59.6 mg/dL (ref >39.00)
LDL Cholesterol: 112 mg/dL — ABNORMAL HIGH (ref 0–99)
NonHDL: 144.22
Total CHOL/HDL Ratio: 3
Triglycerides: 159 mg/dL — ABNORMAL HIGH (ref 0.0–149.0)
VLDL: 31.8 mg/dL (ref 0.0–40.0)

## 2023-10-10 LAB — TSH: TSH: 0.94 u[IU]/mL (ref 0.35–5.50)

## 2023-10-10 LAB — VITAMIN D 25 HYDROXY (VIT D DEFICIENCY, FRACTURES): VITD: 49.65 ng/mL (ref 30.00–100.00)

## 2023-10-10 NOTE — Progress Notes (Signed)
 Subjective  Chief Complaint  Patient presents with   Annual Exam    Pt here for Annual Exam and is currently fasting     HPI: Audrey Cox is a 58 y.o. female who presents to [facility] [place] today for a Female Wellness Visit.   Wellness Visit: annual visit with health maintenance review and exam  Health maintenance: Sees GYN, reviewed recent note from July.  Mammogram and Pap smear are up-to-date and normal.  Colorectal cancer screening is up-to-date.  Follows along with GI for her ulcerative colitis which fortunately is in remission.  Has alpha gal also.  Intermittent upset stomach but overall doing well.  Chronic anxiety depression and life stressors are all manageable at this current time.  On HRT per GYN. Eligible for Prevnar 20 and flu vaccine.  She defers for now, will research to ensure that Prevnar 20 does not have any remaining components.  She does have alpha gal allergy.  Assessment  1. Encounter for well adult exam with abnormal findings   2. Chronic ulcerative proctitis with rectal bleeding (HCC)   3. Attention deficit disorder (ADD) without hyperactivity   4. GAD (generalized anxiety disorder)   5. Tubular adenoma of colon   6. Hormone replacement therapy (HRT)      Plan  Female Wellness Visit: Age appropriate Health Maintenance and Prevention measures were discussed with patient. Included topics are cancer screening recommendations, ways to keep healthy (see AVS) including dietary and exercise recommendations, regular eye and dental care, use of seat belts, and avoidance of moderate alcohol use and tobacco use.  BMI: discussed patient's BMI and encouraged positive lifestyle modifications to help get to or maintain a target BMI. HM needs and immunizations were addressed and ordered. See below for orders. See HM and immunization section for updates.  Patient to research and then follow-up in office for Prevnar and influenza if she decides to have  them.  Education given Routine labs and screening tests ordered including cmp, cbc and lipids where appropriate. Discussed recommendations regarding Vit D and calcium supplementation (see AVS) Chronic conditions are stable, behaviorally managed.  Follow up: 1 year for complete physical  Orders Placed This Encounter  Procedures   HM PAP SMEAR   VITAMIN D  25 Hydroxy (Vit-D Deficiency, Fractures)   CBC with Differential/Platelet   Comprehensive metabolic panel with GFR   Lipid panel   TSH   No orders of the defined types were placed in this encounter.     Body mass index is 22.66 kg/m. Wt Readings from Last 3 Encounters:  10/10/23 149 lb (67.6 kg)  01/16/23 149 lb 6 oz (67.8 kg)  09/13/22 144 lb 2 oz (65.4 kg)     Patient Active Problem List   Diagnosis Date Noted   Tubular adenoma of colon 09/09/2021    Priority: High    Colonoscopy 2021. Repeat 7 years, Dr. Albertus    Chronic ulcerative proctitis (HCC) 09/09/2021    Priority: High   Attention deficit disorder (ADD) without hyperactivity 08/15/2019    Priority: High   Family history of malignant melanoma 04/30/2019    Priority: High    Maternal aunt and grandmother. Sees Derm annually    GAD (generalized anxiety disorder) 11/17/2014    Priority: High   Hormone replacement therapy (HRT) 10/10/2023    Priority: Medium    Allergy to alpha-gal 09/14/2022    Priority: Medium    Perimenopausal symptoms 04/30/2019    Priority: Medium  History of depression 04/30/2019    Priority: Medium     Mild; didn't tolerate SSRIs x 2    Acne vulgaris 09/08/2020    Priority: Low   Rhinitis, allergic 11/17/2014    Priority: Low   Health Maintenance  Topic Date Due   Hepatitis B Vaccines 19-59 Average Risk (1 of 3 - 19+ 3-dose series) Never done   Pneumococcal Vaccine: 50+ Years (1 of 1 - PCV) Never done   MAMMOGRAM  11/24/2022   COVID-19 Vaccine (3 - Pfizer risk series) 10/26/2023 (Originally 10/23/2019)   INFLUENZA VACCINE   05/06/2024 (Originally 09/07/2023)   Colonoscopy  06/04/2026   Cervical Cancer Screening (HPV/Pap Cotest)  11/24/2026   DTaP/Tdap/Td (2 - Td or Tdap) 04/29/2029   Hepatitis C Screening  Completed   HIV Screening  Completed   HPV VACCINES  Aged Out   Meningococcal B Vaccine  Aged Out   Zoster Vaccines- Shingrix  Discontinued   Immunization History  Administered Date(s) Administered   Influenza,inj,Quad PF,6+ Mos 11/17/2014, 12/21/2016, 01/13/2020   Influenza-Unspecified 10/26/2021   PFIZER(Purple Top)SARS-COV-2 Vaccination 09/04/2019, 09/25/2019   Tdap 04/30/2019   Zoster Recombinant(Shingrix) 09/08/2020   We updated and reviewed the patient's past history in detail and it is documented below. Allergies: Patient is allergic to alpha-gal, bactrim [sulfamethoxazole-trimethoprim], and sulfa antibiotics. Past Medical History Patient  has a past medical history of Allergy, Allergy to alpha-gal, Anemia, Anxiety, Aortic atherosclerosis (HCC), Arthritis, Blood transfusion without reported diagnosis, Family history of malignant melanoma (04/30/2019), Heart murmur, History of depression (04/30/2019), Pancreatic insufficiency, Tubular adenoma of colon, and Uterine leiomyoma. Past Surgical History Patient  has a past surgical history that includes Uterine fibroid surgery (8006,8005); Wisdom tooth extraction; and Colonoscopy. Family History: Patient family history includes Arthritis in her father, maternal grandmother, and paternal grandmother; Breast cancer in her maternal aunt; Colon polyps in her father, mother, and paternal grandmother; Depression in her mother; Healthy in her daughter; Heart disease in her maternal grandfather, maternal grandmother, and mother; Hyperlipidemia in her father; Hypertension in her maternal grandmother; Kidney cancer in her maternal grandfather; Leukemia in her father; Melanoma in her maternal aunt and maternal grandmother; Mood Disorder in her sister and son; Prostate  cancer in her maternal grandfather; Rheum arthritis in her paternal grandfather. Social History:  Patient  reports that she has never smoked. She has never been exposed to tobacco smoke. She has never used smokeless tobacco. She reports that she does not currently use alcohol. She reports that she does not currently use drugs.  Review of Systems: Constitutional: negative for fever or malaise Ophthalmic: negative for photophobia, double vision or loss of vision Cardiovascular: negative for chest pain, dyspnea on exertion, or new LE swelling Respiratory: negative for SOB or persistent cough Gastrointestinal: negative for abdominal pain, change in bowel habits or melena Genitourinary: negative for dysuria or gross hematuria, no abnormal uterine bleeding or disharge Musculoskeletal: negative for new gait disturbance or muscular weakness Integumentary: negative for new or persistent rashes, no breast lumps Neurological: negative for TIA or stroke symptoms Psychiatric: negative for SI or delusions Allergic/Immunologic: negative for hives  Patient Care Team    Relationship Specialty Notifications Start End  Jodie Lavern CROME, MD PCP - General Family Medicine  01/16/23   Johnnye Ade, MD Consulting Physician Obstetrics and Gynecology  04/30/19   Haverstock, Tawni CROME, MD Referring Physician Dermatology  04/30/19   Albertus Gordy HERO, MD Consulting Physician Gastroenterology  09/09/21     Objective  Vitals: BP 122/78  Pulse 85   Temp 97.7 F (36.5 C)   Ht 5' 8 (1.727 m)   Wt 149 lb (67.6 kg)   SpO2 99%   BMI 22.66 kg/m  General:  Well developed, well nourished, no acute distress  Psych:  Alert and orientedx3,normal mood and affect HEENT:  Normocephalic, atraumatic, non-icteric sclera,  supple neck without adenopathy, mass or thyromegaly Cardiovascular:  Normal S1, S2, RRR without gallop, rub or murmur Respiratory:  Good breath sounds bilaterally, CTAB with normal respiratory  effort Gastrointestinal: normal bowel sounds, soft, non-tender, no noted masses. No HSM MSK: extremities without edema, joints without erythema or swelling  Commons side effects, risks, benefits, and alternatives for medications and treatment plan prescribed today were discussed, and the patient expressed understanding of the given instructions. Patient is instructed to call or message via MyChart if he/she has any questions or concerns regarding our treatment plan. No barriers to understanding were identified. We discussed Red Flag symptoms and signs in detail. Patient expressed understanding regarding what to do in case of urgent or emergency type symptoms.  Medication list was reconciled, printed and provided to the patient in AVS. Patient instructions and summary information was reviewed with the patient as documented in the AVS. This note was prepared with assistance of Dragon voice recognition software. Occasional wrong-word or sound-a-like substitutions may have occurred due to the inherent limitations of voice recognition software

## 2023-10-10 NOTE — Patient Instructions (Signed)
 Please return in 12 months for your annual complete physical; please come fasting.   I will release your lab results to you on your MyChart account with further instructions. You may see the results before I do, but when I review them I will send you a message with my report or have my assistant call you if things need to be discussed. Please reply to my message with any questions. Thank you!   Please research the components for Prevnar 20, and immunization given to protect you against pneumococcal infections.  You may schedule an appointment with our nurse to have Prevnar 20 and/or influenza vaccine at your convenience.  Glad you are doing well.  I will let you know about the opening of the [facility]!   If you have any questions or concerns, please don't hesitate to send me a message via MyChart or call the office at [phone]. Thank you for visiting with us  today! It's our pleasure caring for you.

## 2023-10-12 ENCOUNTER — Ambulatory Visit: Payer: Self-pay | Admitting: Family Medicine

## 2023-10-12 NOTE — Progress Notes (Signed)
 Labs reviewed.  The 10-year ASCVD risk score (Arnett DK, et al., 2019) is: 1.7%   Values used to calculate the score:     Age: 58 years     Clincally relevant sex: Female     Is Non-Hispanic African American: No     Diabetic: No     Tobacco smoker: No     Systolic Blood Pressure: 122 mmHg     Is BP treated: No     HDL Cholesterol: 59.6 mg/dL     Total Cholesterol: 204 mg/dL

## 2023-10-17 DIAGNOSIS — H16223 Keratoconjunctivitis sicca, not specified as Sjogren's, bilateral: Secondary | ICD-10-CM | POA: Diagnosis not present

## 2023-10-17 DIAGNOSIS — H40013 Open angle with borderline findings, low risk, bilateral: Secondary | ICD-10-CM | POA: Diagnosis not present

## 2023-10-17 DIAGNOSIS — H1045 Other chronic allergic conjunctivitis: Secondary | ICD-10-CM | POA: Diagnosis not present

## 2023-10-22 ENCOUNTER — Telehealth: Payer: Self-pay

## 2023-10-22 NOTE — Telephone Encounter (Signed)
 Pt called and asked if Arleene had a cancellation this month to give her a call

## 2023-10-24 ENCOUNTER — Other Ambulatory Visit

## 2023-10-25 ENCOUNTER — Ambulatory Visit (INDEPENDENT_AMBULATORY_CARE_PROVIDER_SITE_OTHER): Payer: Self-pay

## 2023-10-25 DIAGNOSIS — R238 Other skin changes: Secondary | ICD-10-CM

## 2023-10-25 DIAGNOSIS — Z91018 Allergy to other foods: Secondary | ICD-10-CM

## 2023-10-25 NOTE — Addendum Note (Signed)
 Addended by: EUSTACIO POUR on: 10/25/2023 11:51 AM   Modules accepted: Orders

## 2023-10-25 NOTE — Progress Notes (Signed)
 NAME: Audrey Cox  MRN: [id number]  DOB: [birth year]    Referring physician:??Jodie Lavern CROME, MD  PCP:?Jodie Lavern CROME, MD    CHIEF COMPLAINT:?Facial aesthetics    HPI:?  This is a 58 y.o. year old female with history of alpha gal allergy who presents in consultation for facial aging and botox treatment.    Specifically, the patient is most bothered by the appearance of her facial rhytids and lower eyelids. She is bothered by the excess skin, fatty bags, wrinkles and dark circles of her eyes.     She has not been evaluated by an optomitrist for reduced visual fields as she does not report any issues with vision.   History of dry eyes? Denies   Prior refractive surgery? Denies   Use of neurotoxin/fillers? Yes, before being diagnosed with alpha gal allergy.    Patient denies h/o glaucoma, HTN, coagulopathies, or thyroid  conditions. Not on any anticoagulation.       Family History:   Family history is negative for bleeding/clotting disorders, problems with anesthesia, connective tissue disorders.     Social History:   Social History   Socioeconomic History   Marital status: Married    Spouse name: Not on file   Number of children: 2   Years of education: Not on file   Highest education level: Associate degree: occupational, Scientist, product/process development, or vocational program  Occupational History   Occupation: housewife  Tobacco Use   Smoking status: Never    Passive exposure: Never   Smokeless tobacco: Never  Vaping Use   Vaping status: Never Used  Substance and Sexual Activity   Alcohol use: Not Currently    Comment: occ wine   Drug use: Not Currently   Sexual activity: Yes    Birth control/protection: Post-menopausal  Other Topics Concern   Not on file  Social History Narrative   Originally from [place]      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      Home Situation:lives with husband and son      Spiritual Beliefs: Sherlean, but does not attend church        Lifestyle: regular exercise, healthy diet      Social Drivers of Health   Financial Resource Strain: Low Risk  (10/09/2023)   Overall Financial Resource Strain (CARDIA)    Difficulty of Paying Living Expenses: Not hard at all  Food Insecurity: No Food Insecurity (10/09/2023)   Hunger Vital Sign    Worried About Running Out of Food in the Last Year: Never true    Ran Out of Food in the Last Year: Never true  Transportation Needs: No Transportation Needs (10/09/2023)   PRAPARE - Administrator, Civil Service (Medical): No    Lack of Transportation (Non-Medical): No  Physical Activity: Insufficiently Active (10/09/2023)   Exercise Vital Sign    Days of Exercise per Week: 3 days    Minutes of Exercise per Session: 30 min  Stress: Stress Concern Present (10/09/2023)   [employer] of Occupational Health - Occupational Stress Questionnaire    Feeling of Stress: To some extent  Social Connections: Moderately Isolated (10/09/2023)   Social Connection and Isolation Panel    Frequency of Communication with Friends and Family: More than three times a week    Frequency of Social Gatherings with Friends and Family: Twice a week    Attends Religious Services: Patient declined    Database administrator or Organizations: No    Attends Club or  Organization Meetings: Not on file    Marital Status: Married    Smoker/Vape Denies  Lives: Husband   Recreational Drug use: Denies   PMH:  Past Medical History:  Diagnosis Date   Allergy    Allergy to alpha-gal    Anemia    28 years ago   Anxiety    Aortic atherosclerosis (HCC)    Arthritis    cervical spine and lumbar spine per patient(diagnosed by DC)   Blood transfusion without reported diagnosis    1993 post-op fibroid excision   Family history of malignant melanoma 04/30/2019   Maternal aunt and grandmother. Sees Derm annually   Heart murmur    as a child    History of depression 04/30/2019   Mild; didn't tolerate SSRIs x 2    Pancreatic insufficiency    Tubular adenoma of colon    Uterine leiomyoma       PSH:  Past Surgical History:  Procedure Laterality Date   COLONOSCOPY     UTERINE FIBROID SURGERY  8006,8005   WISDOM TOOTH EXTRACTION        MEDICATIONS:?   Current Outpatient Medications:    Cholecalciferol (VITAMIN D3 PO), Take by mouth daily. 5000 iu daily, Disp: , Rfl:    estradiol  (VIVELLE -DOT) 0.0375 MG/24HR, APPLY 1 PATCH TOPICALLY TO THE SKIN 2 TIMES A WEEK, Disp: , Rfl:    fexofenadine (ALLEGRA) 180 MG tablet, Take 180 mg by mouth as needed. , Disp: , Rfl:    mesalamine  (CANASA ) 1000 MG suppository, UNWRAP AND PLACE 1 SUPPOSITORY RECTALLY EVERY DAY AT BEDTIME, Disp: 90 suppository, Rfl: 1   Omega-3 Fatty Acids (OMEGA-3 FISH OIL PO), Take 1,000 mg by mouth daily., Disp: , Rfl:    Probiotic Product (VSL#3 DS) PACK, Take 1 each by mouth daily at 12 noon., Disp: 30 each, Rfl: 3   progesterone  (PROMETRIUM ) 100 MG capsule, Take 100 mg by mouth daily., Disp: , Rfl:     ALLERGIES:?  is allergic to alpha-gal, bactrim [sulfamethoxazole-trimethoprim], and sulfa antibiotics.    REVIEW OF SYSTEMS:  Review of Systems  ROS negative except as noted in HPI   VITALS:  MA as chaperone. Vitals WNL General: Well appearing, no apparent distress.  HEENT: Normocephalic, atraumatic, PERRL. Overall aged appearance to periorbital region.   -Brow: There are static and dynamic horizontal rhytids of the forehead. Glabellar hypertophy. Brows are at the level of the supraorbital rim. The right one is higher than the left one by 0.5 cm. Compensated brow ptosis present with lateral brow laxity.   -Upper lid: Dermatochalasis of upper lid with excess skin covering lid crease, mild. No medial or lateral bulging - fat or lacrimal gland prolapse.  -Lower lid: Dermatochalasis . Presence of orbital fat prolapse. the lower eyelid lies at the border of the lower limbus. Snap back test is delayed. Positive vector. Normal Bell's  phenomenon. Prominent orbitomalar crease.   Midface: Overall midfacial descent with midfacial volume atrophy. Moderate nasolabial folds, marionette lines, jowls, submental skin laxity and deflated upper and lower lips that lack volume.   Neck: mild skin laxity at midline that extends down to the upper third of her neck, no prominent digastric muscles or submandibular glands, crevicomental angle is not blunted significantly. She does not have significant adiposity of her neck, mostly just skin redundancy      ASSESSMENT/PLAN  Assessment & Plan     We discussed possible interactions with Botox and alpha gal allergy. We will contact [facility].  Frutoso Luz for clearance for Botox injections, to prevent allergic reactions.   The risks, benefits, goals and alternatives of blepharoplasty were explained to the patient in detail as follows. The surgery is cosmetic in nature, and elective. After surgery it is sometimes the eyelids will not close very well and it is imperative that the patient uses the eye medications as prescribed to protect the eye. The risk of bleeding has been stressed and the patient understands that this can lead to blindness, hence the importance of letting us  know if any swelling on one side usually develops or gets worse and or she develops pain and vision issues, even blindness., Other potentially functional risks discussed include dry eyes, cornea or other eye injury, inability to completely close the eyes, ectropion (rolled out, droopy lower lid), blindness, and need for additional surgery at additional expense. Infection and scarring are not common but can happen. Aesthetic risks or dissatisfaction can arise from asymmetry, and change in the shape of the eyelid that is possible after blepharoplasty. Asymmetry is quite common in every person and sometimes surgery can unmask this or accentuate this. If we are to remove the corrugator muscle, there is a risk of numbness and  neuropathic pain in the forehead.    We discussed temporal browlift. This is usually performed in conjunction with a blepharoplasty. Risks specific to this portion include hair loss, recurrent brow ptosis, asymmetry and VII nerve dysfunction and the usual surgical risks.      We discussed fat grafting. We are never sure how much fat will remain and other risks relate to donor site infection, irregularity and pain. Irregularity at the site of injection and chronic infection is a remote possibility.  We discussed the significant anti aging benefit of volume replacement.     We discussed skin rejuvenation including HALO laser. The usual postoperative course including the 'rawness' of the skin and the need to keep it covered in ointment was stressed. Risks include scarring, pigment change and recalcitrant rhytids.    We did discuss surgery and anesthesia in general and the risks related such as DVT/PE, infection, bleeding, organ injury, and other cardiovascular events. These are unexpected but the risk thereof can never be 0%.     The patient has a good understanding of all the risks and benefits, postoperative course and care. We obtained pictures and will give the patient quotes. All questions were answered.     The plan agreed upon includes: Clearance for Botox injection from allergist. Lower blepharoplasty after family  wedding in October. We will submit the case request once she comes back for her Botox appointment.     I spent 30 minutes counseling the patient and discussing plan of care.  Raegan Winders M Nellene Courtois

## 2023-12-03 DIAGNOSIS — Z1231 Encounter for screening mammogram for malignant neoplasm of breast: Secondary | ICD-10-CM | POA: Diagnosis not present

## 2023-12-03 DIAGNOSIS — Z01419 Encounter for gynecological examination (general) (routine) without abnormal findings: Secondary | ICD-10-CM | POA: Diagnosis not present

## 2023-12-03 DIAGNOSIS — Z1151 Encounter for screening for human papillomavirus (HPV): Secondary | ICD-10-CM | POA: Diagnosis not present

## 2023-12-03 DIAGNOSIS — Z6822 Body mass index (BMI) 22.0-22.9, adult: Secondary | ICD-10-CM | POA: Diagnosis not present

## 2023-12-03 DIAGNOSIS — Z124 Encounter for screening for malignant neoplasm of cervix: Secondary | ICD-10-CM | POA: Diagnosis not present

## 2024-01-21 DIAGNOSIS — Z719 Counseling, unspecified: Secondary | ICD-10-CM

## 2024-03-13 ENCOUNTER — Other Ambulatory Visit: Payer: Self-pay | Admitting: Internal Medicine

## 2024-10-15 ENCOUNTER — Encounter: Admitting: Family Medicine
